# Patient Record
Sex: Female | Born: 1985 | Race: White | Hispanic: No | Smoking: Former smoker
Health system: Southern US, Community
[De-identification: ages and names within clinical notes are randomized; demographics above are authoritative.]

## PROBLEM LIST (undated history)

## (undated) ENCOUNTER — Inpatient Hospital Stay (HOSPITAL_COMMUNITY): Payer: Self-pay

## (undated) DIAGNOSIS — N200 Calculus of kidney: Secondary | ICD-10-CM

## (undated) DIAGNOSIS — F329 Major depressive disorder, single episode, unspecified: Secondary | ICD-10-CM

## (undated) DIAGNOSIS — R87619 Unspecified abnormal cytological findings in specimens from cervix uteri: Secondary | ICD-10-CM

## (undated) DIAGNOSIS — B977 Papillomavirus as the cause of diseases classified elsewhere: Secondary | ICD-10-CM

## (undated) DIAGNOSIS — G43909 Migraine, unspecified, not intractable, without status migrainosus: Secondary | ICD-10-CM

## (undated) DIAGNOSIS — R002 Palpitations: Secondary | ICD-10-CM

## (undated) DIAGNOSIS — H9191 Unspecified hearing loss, right ear: Secondary | ICD-10-CM

## (undated) DIAGNOSIS — F419 Anxiety disorder, unspecified: Secondary | ICD-10-CM

## (undated) DIAGNOSIS — B999 Unspecified infectious disease: Secondary | ICD-10-CM

## (undated) HISTORY — DX: Major depressive disorder, single episode, unspecified: F32.9

## (undated) HISTORY — DX: Anxiety disorder, unspecified: F41.9

## (undated) HISTORY — DX: Papillomavirus as the cause of diseases classified elsewhere: B97.7

## (undated) HISTORY — PX: DILATION AND CURETTAGE OF UTERUS: SHX78

## (undated) HISTORY — DX: Migraine, unspecified, not intractable, without status migrainosus: G43.909

## (undated) HISTORY — PX: OTHER SURGICAL HISTORY: SHX169

## (undated) HISTORY — DX: Unspecified abnormal cytological findings in specimens from cervix uteri: R87.619

---
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
## 2010-12-14 DIAGNOSIS — B977 Papillomavirus as the cause of diseases classified elsewhere: Secondary | ICD-10-CM

## 2010-12-14 HISTORY — DX: Papillomavirus as the cause of diseases classified elsewhere: B97.7

## 2010-12-14 HISTORY — PX: COLPOSCOPY: SHX161

## 2010-12-14 HISTORY — PX: LEEP: SHX91

## 2011-03-13 HISTORY — PX: OTHER SURGICAL HISTORY: SHX169

## 2015-03-06 ENCOUNTER — Ambulatory Visit (INDEPENDENT_AMBULATORY_CARE_PROVIDER_SITE_OTHER): Payer: BLUE CROSS/BLUE SHIELD | Admitting: Family Medicine

## 2015-03-06 VITALS — BP 110/66 | HR 56 | Temp 98.0°F | Resp 16 | Ht 64.5 in | Wt 208.0 lb

## 2015-03-06 DIAGNOSIS — N9489 Other specified conditions associated with female genital organs and menstrual cycle: Secondary | ICD-10-CM

## 2015-03-06 DIAGNOSIS — N898 Other specified noninflammatory disorders of vagina: Secondary | ICD-10-CM

## 2015-03-06 LAB — POCT WET PREP WITH KOH
BACTERIA WET PREP HPF POC: NEGATIVE
Clue Cells Wet Prep HPF POC: NEGATIVE
KOH Prep POC: NEGATIVE
RBC WET PREP PER HPF POC: NEGATIVE
Trichomonas, UA: NEGATIVE
WBC WET PREP PER HPF POC: NEGATIVE
Yeast Wet Prep HPF POC: NEGATIVE

## 2015-03-06 NOTE — Progress Notes (Signed)
[facility] [address] [place] [address] [phone]  Date:  03/06/2015   Name:  Jenny Anderson   DOB:  [birth year]   MRN:  [id number]  PCP:  No PCP Per Patient    Chief Complaint: Lost tampon   History of Present Illness:  Jenny Anderson is a 29 y.o. very pleasant female patient who presents with the following:  About 10 days ago she went [place] the zoo and when she got home she thought that she had removed her tampon but was not really sure.  In any case she forgot about it until she noticed some vaginal odor yesterday.  She is concerned that there could be a retained tampon. She had finished her menses a few days ago; did have some more prlonged brown discharge at the end of her menses.   Today she feels a little pressure, like there might a tampon inside.   No fever, chills or vomiting.  No belly pain.   She is generally in good health.   No urinary sx  There are no active problems to display for this patient.   Past Medical History  Diagnosis Date  . Anxiety   . Depression     History reviewed. No pertinent past surgical history.  History  Substance Use Topics  . Smoking status: Former Smoker    Quit date: 02/05/2015  . Smokeless tobacco: Not on file  . Alcohol Use: No    Family History  Problem Relation Age of Onset  . Hyperlipidemia Mother   . Cancer Maternal Grandfather   . Diabetes Paternal Grandmother   . Heart disease Paternal Grandmother   . Cancer Paternal Grandfather     Not on File  Medication list has been reviewed and updated.  No current outpatient prescriptions on file prior to visit.   No current facility-administered medications on file prior to visit.    Review of Systems:  As per HPI- otherwise negative.   Physical Examination: Filed Vitals:   03/06/15 1101  BP: 110/66  Pulse: 56  Temp: 98 F (36.7 C)  Resp: 16   Filed Vitals:   03/06/15 1101  Height: 5' 4.5" (1.638 m)  Weight: 208 lb  (94.348 kg)   Body mass index is 35.16 kg/(m^2). Ideal Body Weight: Weight in (lb) to have BMI = 25: 147.6  GEN: WDWN, NAD, Non-toxic, A & O x 3, overweight, looks well HEENT: Atraumatic, Normocephalic. Neck supple. No masses, No LAD. Ears and Nose: No external deformity. CV: RRR, No M/G/R. No JVD. No thrill. No extra heart sounds. PULM: CTA B, no wheezes, crackles, rhonchi. No retractions. No resp. distress. No accessory muscle use. ABD: S, NT, ND. No rebound. No HSM.  Benign belly EXTR: No c/c/e NEURO Normal gait.  PSYCH: Normally interactive. Conversant. Not depressed or anxious appearing.  Calm demeanor.  GU: no retained tampon on exam.  GU exam OW normal- no abnormal discharge, no lesion, no adnexal tenderness or masses, no CMT  Results for orders placed or performed in visit on 03/06/15  POCT Wet Prep with KOH  Result Value Ref Range   Trichomonas, UA Negative    Clue Cells Wet Prep HPF POC neg    Epithelial Wet Prep HPF POC 4-10    Yeast Wet Prep HPF POC neg    Bacteria Wet Prep HPF POC neg    RBC Wet Prep HPF POC neg    WBC Wet Prep HPF POC neg  KOH Prep POC Negative     Assessment and Plan: Vaginal odor - Plan: POCT Wet Prep with KOH  No retained tampon or other FB.  Reassured that her WP is normal.  She will let us know if not feeling back to normal soon- Sooner if worse.     Signed Lamar Blinks, MD

## 2015-03-06 NOTE — Patient Instructions (Signed)
I will check your swabs for any sign of a bacterial infection and will give you a call

## 2015-04-24 ENCOUNTER — Ambulatory Visit (INDEPENDENT_AMBULATORY_CARE_PROVIDER_SITE_OTHER): Payer: BLUE CROSS/BLUE SHIELD | Admitting: Physician Assistant

## 2015-04-24 VITALS — BP 122/74 | HR 78 | Temp 98.4°F | Resp 17 | Ht 64.5 in | Wt 212.0 lb

## 2015-04-24 DIAGNOSIS — Z20818 Contact with and (suspected) exposure to other bacterial communicable diseases: Secondary | ICD-10-CM

## 2015-04-24 DIAGNOSIS — Z2089 Contact with and (suspected) exposure to other communicable diseases: Secondary | ICD-10-CM | POA: Diagnosis not present

## 2015-04-24 DIAGNOSIS — R070 Pain in throat: Secondary | ICD-10-CM

## 2015-04-24 MED ORDER — AMOXICILLIN 875 MG PO TABS
875.0000 mg | ORAL_TABLET | Freq: Two times a day (BID) | ORAL | Status: AC
Start: 2015-04-24 — End: 2015-05-04

## 2015-04-24 NOTE — Progress Notes (Signed)
[facility] [address], [place] [address] [phone]  Date:  04/24/2015   Name:  Jenny Anderson   DOB:  [birth year]   MRN:  [id number]  PCP:  No PCP Per Patient    Chief Complaint: Sore Throat   History of Present Illness:  Jenny Anderson is a 29 y.o. very pleasant female patient who presents with the following:  Patient is here today with a sorethroat that began yesterday.  It is both sides, however she has noticed an aggravating right ear pain.  She has no fever, sob, dyspnea, chills, cough, or nasal congestion.  She was recently in our office 2 days ago, with daughter who had a sore throat with atypical features.  She had a positive strep and was treated.    There are no active problems to display for this patient.   Past Medical History  Diagnosis Date  . Anxiety   . Depression     History reviewed. No pertinent past surgical history.  History  Substance Use Topics  . Smoking status: Former Smoker    Quit date: 02/05/2015  . Smokeless tobacco: Not on file  . Alcohol Use: No    Family History  Problem Relation Age of Onset  . Hyperlipidemia Mother   . Cancer Maternal Grandfather   . Diabetes Paternal Grandmother   . Heart disease Paternal Grandmother   . Cancer Paternal Grandfather     No Known Allergies  Medication list has been reviewed and updated.  Current Outpatient Prescriptions on File Prior to Visit  Medication Sig Dispense Refill  . amphetamine-dextroamphetamine (ADDERALL) 15 MG tablet Take 15 mg by mouth daily.    . norgestimate-ethinyl estradiol (ORTHO-CYCLEN,SPRINTEC,PREVIFEM) 0.25-35 MG-MCG tablet Take 1 tablet by mouth daily.     No current facility-administered medications on file prior to visit.    Review of Systems: ROS otherwise unremarkable unless listed above.  Physical Examination: Filed Vitals:   04/24/15 0825  BP: 122/74  Pulse: 78  Temp: 98.4 F (36.9 C)  Resp: 17   Filed Vitals:   04/24/15 0825  Height: 5' 4.5" (1.638 m)  Weight: 212 lb (96.163 kg)   Body mass index is 35.84 kg/(m^2). Ideal Body Weight: Weight in (lb) to have BMI = 25: 147.6 Physical Exam  Constitutional: She is oriented to person, place, and time. She appears well-developed and well-nourished. No distress.  HENT:  Head: Normocephalic and atraumatic.  Right Ear: Tympanic membrane, external ear and ear canal normal.  Left Ear: Tympanic membrane, external ear and ear canal normal.  Nose: Nose normal. No mucosal edema or rhinorrhea.  Mouth/Throat: No uvula swelling. Posterior oropharyngeal erythema present. No oropharyngeal exudate or posterior oropharyngeal edema.  Eyes: Conjunctivae and EOM are normal. Pupils are equal, round, and reactive to light. Right eye exhibits no discharge. Left eye exhibits no discharge.  Neck: Normal range of motion. Neck supple. No tracheal deviation present. No thyromegaly present.  Cardiovascular: Normal rate, regular rhythm and normal heart sounds.  Exam reveals no friction rub.   No murmur heard. Pulmonary/Chest: Effort normal and breath sounds normal. No respiratory distress. She has no wheezes.  Lymphadenopathy:    She has no cervical adenopathy.  Neurological: She is alert and oriented to person, place, and time.  Skin: Skin is warm and dry. No rash noted. She is not diaphoretic.  Psychiatric: She has a normal mood and affect. Her behavior is normal.     Assessment and Plan: 29 year old female  is here today for chief complaint of sore throat.  Daughter was here two days ago with positive strep.   -Though not a great presentation, direct exposure and sxs significant.  Will treat today. -Declines, magic mouthwash.  Advised cepacol, tylenol, and ibuprofen for pain.  Exposure to strep throat - Plan: amoxicillin (AMOXIL) 875 MG tablet  Throat pain - Plan: amoxicillin (AMOXIL) 875 MG tablet   Ivar Drape, PA-C [facility] 5/13/201611:51 AM

## 2015-04-24 NOTE — Patient Instructions (Signed)

## 2015-05-01 ENCOUNTER — Encounter: Payer: Self-pay | Admitting: Certified Nurse Midwife

## 2015-07-04 ENCOUNTER — Ambulatory Visit (INDEPENDENT_AMBULATORY_CARE_PROVIDER_SITE_OTHER): Payer: BLUE CROSS/BLUE SHIELD | Admitting: Certified Nurse Midwife

## 2015-07-04 ENCOUNTER — Encounter: Payer: Self-pay | Admitting: Certified Nurse Midwife

## 2015-07-04 VITALS — BP 120/70 | HR 70 | Resp 16 | Ht 64.25 in | Wt 212.0 lb

## 2015-07-04 DIAGNOSIS — Z8742 Personal history of other diseases of the female genital tract: Secondary | ICD-10-CM | POA: Diagnosis not present

## 2015-07-04 DIAGNOSIS — Z01419 Encounter for gynecological examination (general) (routine) without abnormal findings: Secondary | ICD-10-CM

## 2015-07-04 DIAGNOSIS — Z0000 Encounter for general adult medical examination without abnormal findings: Secondary | ICD-10-CM | POA: Diagnosis not present

## 2015-07-04 DIAGNOSIS — Z124 Encounter for screening for malignant neoplasm of cervix: Secondary | ICD-10-CM | POA: Diagnosis not present

## 2015-07-04 DIAGNOSIS — Z3041 Encounter for surveillance of contraceptive pills: Secondary | ICD-10-CM | POA: Diagnosis not present

## 2015-07-04 LAB — POCT URINALYSIS DIPSTICK
BILIRUBIN UA: NEGATIVE
Blood, UA: NEGATIVE
Glucose, UA: NEGATIVE
KETONES UA: NEGATIVE
LEUKOCYTES UA: NEGATIVE
NITRITE UA: NEGATIVE
Protein, UA: NEGATIVE
Urobilinogen, UA: NEGATIVE
pH, UA: 5

## 2015-07-04 MED ORDER — NORGESTIMATE-ETH ESTRADIOL 0.25-35 MG-MCG PO TABS: 1.0000 | ORAL_TABLET | Freq: Every day | ORAL | Status: DC

## 2015-07-04 NOTE — Progress Notes (Addendum)
29 y.o. Jenny Anderson Married  Caucasian Fe here to establish gyn care and for annual exam. Moved here recently from [place]. Has established with PCP was seen for UTI. Periods normal, no issues . Contraception Rankin working well. Previous Mirena IUD with pregnancy, undetected, removed with SAB. Previous Nuvaring use , unhappy with insertion and removal monthly. Prefers OCP. Previous Adderall use only for work and attention issues. No other health issues today.  Patient's last menstrual period was 06/20/2015.          Sexually active: Yes.    The current method of family planning is OCP (estrogen/progesterone).    Exercising: No.  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 She does not have any smokeless tobacco history on file. She reports that she does not drink alcohol or use illicit drugs.  Past Medical History  Diagnosis Date  . Anxiety   . Depression   . HPV in female 2012  . Abnormal Pap smear of cervix   . Migraines     sensitivity to light    Past Surgical History  Procedure Laterality Date  . Leep  2012  . Colposcopy  2012  . Dilation and curettage of uterus    . Mirena      pt was pregnant with iud in place    Current Outpatient Prescriptions  Medication Sig Dispense Refill  . norgestimate-ethinyl estradiol (ORTHO-CYCLEN,SPRINTEC,PREVIFEM) 0.25-35 MG-MCG tablet Take 1 tablet by mouth daily.    Marland Kitchen amphetamine-dextroamphetamine (ADDERALL) 10 MG tablet Take 10 mg by mouth as needed.    . sulfamethoxazole-trimethoprim (BACTRIM DS,SEPTRA DS) 800-160 MG per tablet      No current facility-administered medications for this visit.    Family History  Problem Relation Age of Onset  . Hyperlipidemia Mother   .  Hypertension Mother   . Other Mother     hx of substance abuse  . Lung cancer Maternal Grandfather   . Lung cancer Paternal Grandfather   . Hyperlipidemia Paternal Grandfather   . Other Father     hx substance abuse  . Hypertension Maternal Grandmother   . Hyperlipidemia Maternal Grandmother   . Heart disease Maternal Grandmother   . Diabetes Maternal Grandmother   . Kidney disease Maternal Grandmother     ROS:  Pertinent items are noted in HPI.  Otherwise, a comprehensive ROS was negative.  Exam:   BP 120/70 mmHg  Pulse 70  Resp 16  Ht 5' 4.25" (1.632 m)  Wt 212 lb (96.163 kg)  BMI 36.11 kg/m2  LMP 06/20/2015 Height: 5' 4.25" (163.2 cm) Ht Readings from Last 3 Encounters:  07/04/15 5' 4.25" (1.632 m)  04/24/15 5' 4.5" (1.638 m)  03/06/15 5' 4.5" (1.638 m)    General appearance: alert, cooperative and appears stated age Head: Normocephalic, without obvious abnormality, atraumatic Neck: no adenopathy, supple, symmetrical, trachea midline and thyroid normal to inspection and palpation Lungs: clear to auscultation bilaterally Breasts: normal appearance, no masses or tenderness, No nipple retraction or dimpling, No nipple discharge or bleeding, No axillary or supraclavicular adenopathy Heart: regular rate and rhythm Abdomen: soft, non-tender; no masses,  no organomegaly, umbilical area of piercing slight increase pink with ointment noted on  area, no exudate Extremities: extremities normal, atraumatic, no cyanosis or edema Skin: Skin color, texture, turgor normal. No rashes or lesions Lymph nodes: Cervical, supraclavicular, and axillary nodes normal. No abnormal inguinal nodes palpated Neurologic: Grossly normal   Pelvic: External genitalia:  no lesions              Urethra:  normal appearing urethra with no masses, tenderness or lesions              Bartholin's and Skene's: normal                 Vagina: normal appearing vagina with normal color and discharge, no lesions               Cervix: normal, non tender, no lesions CKC appearance, slight bleeding with pap only              Pap taken: Yes.   Bimanual Exam:  Uterus:  normal size, contour, position, consistency, mobility, non-tender              Adnexa: normal adnexa and no mass, fullness, tenderness               Rectovaginal: Confirms               Anus:  normal appearance  Chaperone present: Yes  A:  Well Woman with normal exam  Contraception desires OCP  Previous history of abnormal paps with HPV and dysplasia, with LEEP in 2012  Previous smoker has now stopped for the past 4 months  History of ADD with Aderral use prn PCP management  Skin inflammation around piercing at umbilicus, has antibiotic RX, for treatment  P:   Reviewed health and wellness pertinent to exam  Rx Sprintec see order  Discussed importance of yearly pap smears for 20 years from CKC date of 02/25/11  Discussed nicotine and HPV relationship and encouraged to continue to be a non smoker  Continue follow up with MD as needed  Pap smear taken wit HPVHR   counseled on breast self exam, use and side effects of OCP's, adequate intake of calcium and vitamin D, diet and exercise  return annually or prn  An After Visit Summary was printed and given to the patient.

## 2015-07-04 NOTE — Patient Instructions (Signed)
General topics  Next pap or exam is  due in 1 year Take a Women's multivitamin Take 1200 mg. of calcium daily - prefer dietary If any concerns in interim to call back  Breast Self-Awareness Practicing breast self-awareness may pick up problems early, prevent significant medical complications, and possibly save your life. By practicing breast self-awareness, you can become familiar with how your breasts look and feel and if your breasts are changing. This allows you to notice changes early. It can also offer you some reassurance that your breast health is good. One way to learn what is normal for your breasts and whether your breasts are changing is to do a breast self-exam. If you find a lump or something that was not present in the past, it is best to contact your caregiver right away. Other findings that should be evaluated by your caregiver include nipple discharge, especially if it is bloody; skin changes or reddening; areas where the skin seems to be pulled in (retracted); or new lumps and bumps. Breast pain is seldom associated with cancer (malignancy), but should also be evaluated by a caregiver. BREAST SELF-EXAM The best time to examine your breasts is 5 7 days after your menstrual period is over.  ExitCare Patient Information 2013 [employer].   Exercise to Stay Healthy Exercise helps you become and stay healthy. EXERCISE IDEAS AND TIPS Choose exercises that:  You enjoy.  Fit into your day. You do not need to exercise really hard to be healthy. You can do exercises at a slow or medium level and stay healthy. You can:  Stretch before and after working out.  Try yoga, Pilates, or tai chi.  Lift weights.  Walk fast, swim, jog, run, climb stairs, bicycle, dance, or rollerskate.  Take aerobic classes. Exercises that burn about 150 calories:  Running 1  miles in 15 minutes.  Playing volleyball for 45 to 60 minutes.  Washing and waxing a car for 45 to 60  minutes.  Playing touch football for 45 minutes.  Walking 1  miles in 35 minutes.  Pushing a stroller 1  miles in 30 minutes.  Playing basketball for 30 minutes.  Raking leaves for 30 minutes.  Bicycling 5 miles in 30 minutes.  Walking 2 miles in 30 minutes.  Dancing for 30 minutes.  Shoveling snow for 15 minutes.  Swimming laps for 20 minutes.  Walking up stairs for 15 minutes.  Bicycling 4 miles in 15 minutes.  Gardening for 30 to 45 minutes.  Jumping rope for 15 minutes.  Washing windows or floors for 45 to 60 minutes. Document Released: 01/02/2011 Document Revised: 02/22/2012 Document Reviewed: 01/02/2011 ExitCare Patient Information 2013 [employer].   Other topics ( that may be useful information):    Sexually Transmitted Disease Sexually transmitted disease (STD) refers to any infection that is passed from person to person during sexual activity. This may happen by way of saliva, semen, blood, vaginal mucus, or urine. Common STDs include:  Gonorrhea.  Chlamydia.  Syphilis.  HIV/AIDS.  Genital herpes.  Hepatitis B and C.  Trichomonas.  Human papillomavirus (HPV).  Pubic lice. CAUSES  An STD may be spread by bacteria, virus, or parasite. A person can get an STD by:  Sexual intercourse with an infected person.  Sharing sex toys with an infected person.  Sharing needles with an infected person.  Having intimate contact with the genitals, mouth, or rectal areas of an infected person. SYMPTOMS  Some people may not have any symptoms, but   they can still pass the infection to others. Different STDs have different symptoms. Symptoms include:  Painful or bloody urination.  Pain in the pelvis, abdomen, vagina, anus, throat, or eyes.  Skin rash, itching, irritation, growths, or sores (lesions). These usually occur in the genital or anal area.  Abnormal vaginal discharge.  Penile discharge in men.  Soft, flesh-colored skin growths in the  genital or anal area.  Fever.  Pain or bleeding during sexual intercourse.  Swollen glands in the groin area.  Yellow skin and eyes (jaundice). This is seen with hepatitis. DIAGNOSIS  To make a diagnosis, your caregiver may:  Take a medical history.  Perform a physical exam.  Take a specimen (culture) to be examined.  Examine a sample of discharge under a microscope.  Perform blood test TREATMENT   Chlamydia, gonorrhea, trichomonas, and syphilis can be cured with antibiotic medicine.  Genital herpes, hepatitis, and HIV can be treated, but not cured, with prescribed medicines. The medicines will lessen the symptoms.  Genital warts from HPV can be treated with medicine or by freezing, burning (electrocautery), or surgery. Warts may come back.  HPV is a virus and cannot be cured with medicine or surgery.However, abnormal areas may be followed very closely by your caregiver and may be removed from the cervix, vagina, or vulva through office procedures or surgery. If your diagnosis is confirmed, your recent sexual partners need treatment. This is true even if they are symptom-free or have a negative culture or evaluation. They should not have sex until their caregiver says it is okay. HOME CARE INSTRUCTIONS  All sexual partners should be informed, tested, and treated for all STDs.  Take your antibiotics as directed. Finish them even if you start to feel better.  Only take over-the-counter or prescription medicines for pain, discomfort, or fever as directed by your caregiver.  Rest.  Eat a balanced diet and drink enough fluids to keep your urine clear or pale yellow.  Do not have sex until treatment is completed and you have followed up with your caregiver. STDs should be checked after treatment.  Keep all follow-up appointments, Pap tests, and blood tests as directed by your caregiver.  Only use latex condoms and water-soluble lubricants during sexual activity. Do not use  petroleum jelly or oils.  Avoid alcohol and illegal drugs.  Get vaccinated for HPV and hepatitis. If you have not received these vaccines in the past, talk to your caregiver about whether one or both might be right for you.  Avoid risky sex practices that can break the skin. The only way to avoid getting an STD is to avoid all sexual activity.Latex condoms and dental dams (for oral sex) will help lessen the risk of getting an STD, but will not completely eliminate the risk. SEEK MEDICAL CARE IF:   You have a fever.  You have any new or worsening symptoms. Document Released: 02/20/2003 Document Revised: 02/22/2012 Document Reviewed: 02/27/2011 [facility] Patient Information 2013 [place].    Domestic Abuse You are being battered or abused if someone close to you hits, pushes, or physically hurts you in any way. You also are being abused if you are forced into activities. You are being sexually abused if you are forced to have sexual contact of any kind. You are being emotionally abused if you are made to feel worthless or if you are constantly threatened. It is important to remember that help is available. No one has the right to abuse you. PREVENTION OF FURTHER  ABUSE  Learn the warning signs of danger. This varies with situations but may include: the use of alcohol, threats, isolation from friends and family, or forced sexual contact. Leave if you feel that violence is going to occur.  If you are attacked or beaten, report it to the police so the abuse is documented. You do not have to press charges. The police can protect you while you or the attackers are leaving. Get the officer's name and badge number and a copy of the report.  Find someone you can trust and tell them what is happening to you: your caregiver, a nurse, clergy member, close friend or family member. Feeling ashamed is natural, but remember that you have done nothing wrong. No one deserves abuse. Document Released:  11/27/2000 Document Revised: 02/22/2012 Document Reviewed: 02/05/2011 ExitCare Patient Information 2013 [employer].    How Much is Too Much Alcohol? Drinking too much alcohol can cause injury, accidents, and health problems. These types of problems can include:   Car crashes.  Falls.  Family fighting (domestic violence).  Drowning.  Fights.  Injuries.  Burns.  Damage to certain organs.  Having a baby with birth defects. ONE DRINK CAN BE TOO MUCH WHEN YOU ARE:  Working.  Pregnant or breastfeeding.  Taking medicines. Ask your doctor.  Driving or planning to drive. If you or someone you know has a drinking problem, get help from a doctor.  Document Released: 09/26/2009 Document Revised: 02/22/2012 Document Reviewed: 09/26/2009 ExitCare Patient Information 2013 [employer]Care[employer].   Smoking Hazards Smoking cigarettes is extremely bad for your health. Tobacco smoke has over 200 known poisons in it. There are over 60 chemicals in tobacco smoke that cause cancer. Some of the chemicals found in cigarette smoke include:   Cyanide.  Benzene.  Formaldehyde.  Methanol (wood alcohol).  Acetylene (fuel used in welding torches).  Ammonia. Cigarette smoke also contains the poisonous gases nitrogen oxide and carbon monoxide.  Cigarette smokers have an increased risk of many serious medical problems and Smoking causes approximately:  90% of all lung cancer deaths in men.  80% of all lung cancer deaths in women.  90% of deaths from chronic obstructive lung disease. Compared with nonsmokers, smoking increases the risk of:  Coronary heart disease by 2 to 4 times.  Stroke by 2 to 4 times.  Men developing lung cancer by 23 times.  Women developing lung cancer by 13 times.  Dying from chronic obstructive lung diseases by 12 times.  . Smoking is the most preventable cause of death and disease in our society.  WHY IS SMOKING ADDICTIVE?  Nicotine is the chemical  agent in tobacco that is capable of causing addiction or dependence.  When you smoke and inhale, nicotine is absorbed rapidly into the bloodstream through your lungs. Nicotine absorbed through the lungs is capable of creating a powerful addiction. Both inhaled and non-inhaled nicotine may be addictive.  Addiction studies of cigarettes and spit tobacco show that addiction to nicotine occurs mainly during the teen years, when young people begin using tobacco products. WHAT ARE THE BENEFITS OF QUITTING?  There are many health benefits to quitting smoking.   Likelihood of developing cancer and heart disease decreases. Health improvements are seen almost immediately.  Blood pressure, pulse rate, and breathing patterns start returning to normal soon after quitting. QUITTING SMOKING   American Lung Association - 1-800-LUNGUSA  American Cancer Society - 1-800-ACS-2345 Document Released: 01/07/2005 Document Revised: 02/22/2012 Document Reviewed: 09/11/2009 ExitCare Patient Information 2013 ExitCare,   LLC.   Stress Management Stress is a state of physical or mental tension that often results from changes in your life or normal routine. Some common causes of stress are:  Death of a loved one.  Injuries or severe illnesses.  Getting fired or changing jobs.  Moving into a new home. Other causes may be:  Sexual problems.  Business or financial losses.  Taking on a large debt.  Regular conflict with someone at home or at work.  Constant tiredness from lack of sleep. It is not just bad things that are stressful. It may be stressful to:  Win the lottery.  Get married.  Buy a new car. The amount of stress that can be easily tolerated varies from person to person. Changes generally cause stress, regardless of the types of change. Too much stress can affect your health. It may lead to physical or emotional problems. Too little stress (boredom) may also become stressful. SUGGESTIONS TO  REDUCE STRESS:  Talk things over with your family and friends. It often is helpful to share your concerns and worries. If you feel your problem is serious, you may want to get help from a professional counselor.  Consider your problems one at a time instead of lumping them all together. Trying to take care of everything at once may seem impossible. List all the things you need to do and then start with the most important one. Set a goal to accomplish 2 or 3 things each day. If you expect to do too many in a single day you will naturally fail, causing you to feel even more stressed.  Do not use alcohol or drugs to relieve stress. Although you may feel better for a short time, they do not remove the problems that caused the stress. They can also be habit forming.  Exercise regularly - at least 3 times per week. Physical exercise can help to relieve that "uptight" feeling and will relax you.  The shortest distance between despair and hope is often a good night's sleep.  Go to bed and get up on time allowing yourself time for appointments without being rushed.  Take a short "time-out" period from any stressful situation that occurs during the day. Close your eyes and take some deep breaths. Starting with the muscles in your face, tense them, hold it for a few seconds, then relax. Repeat this with the muscles in your neck, shoulders, hand, stomach, back and legs.  Take good care of yourself. Eat a balanced diet and get plenty of rest.  Schedule time for having fun. Take a break from your daily routine to relax. HOME CARE INSTRUCTIONS   Call if you feel overwhelmed by your problems and feel you can no longer manage them on your own.  Return immediately if you feel like hurting yourself or someone else. Document Released: 05/26/2001 Document Revised: 02/22/2012 Document Reviewed: 01/16/2008 [facility] Patient Information 2013 [place].  Folic Acid in Pregnancy Folic acid is a B vitamin that  helps prevent neural tube defects (NTDs). The neural tube is the part of a developing baby that becomes the brain and spinal cord. When the neural tube does not close properly, a baby is born with an NTD. NTDs include spina bifida, hernia of the spinal cord, and the absence of part or all of the brain (anencephaly).  Take folic acid at least 4 weeks before getting pregnant and through the first 3 months of pregnancy. This is when the neural tube is developing. It  is available in most multivitamins, as a folic-acid-only supplement, and in some foods. Taking the right amount of folic acid before conception and during pregnancy lessens the chance of having a baby born with an NTD. Giving folic acid will not affect a neural tube defect if it is already present. DIAGNOSIS   An alpha fetoprotein (AFP) blood or amniotic fluid test will show high levels of the alpha fetoprotein if a woman is carrying a baby with an NTD. This test is done on all pregnant women in the first trimester.  An ultrasound may detect an NTD. WHAT YOU CAN DO:  Take a multivitamin with at least 0.4 milligrams (400 micrograms) of folic acid daily at least 4 weeks before getting pregnant and through the first 12 weeks of pregnancy.  If you have already had a pregnancy affected by an NTD, take 4 milligrams (4,000 micrograms) of folic acid daily. Take this amount 1 month before you start trying to get pregnant and continue through the first 3 months of pregnancy. If you have a seizure disorder or take medicines to control seizures, tell your maternity care provider. Continue to take your folic acid unless you are told otherwise.  FOLIC ACID IN FOODS Eat a healthy diet that has foods that contain folic acid, the natural form of the vitamin. Such foods include:  Fortified breakfast cereals.  Lentils.  Asparagus.  Spinach.  Organ meats (liver).  Black beans.  Peanuts (eat only if you do not have a peanut  allergy).  Broccoli.  Strawberries, oranges.  Orange juice (from concentrate is best).  Enriched breads and pasta.  Romaine lettuce. TALK TO YOUR HEALTH CARE PROVIDER IF:  You are in your first trimester and have high blood sugar.  You are in your first trimester and develop a high fever. In almost all cases, a fetus found to have an NTD will need specialized care that may not be available in all hospitals. Talk to your health care provider about what is best for you and your baby. Document Released: 12/03/2003 Document Revised: 04/16/2014 Document Reviewed: 03/05/2010 [facility] Patient Information 2015 [place], [place]. This information is not intended to replace advice given to you by your health care provider. Make sure you discuss any questions you have with your health care provider.

## 2015-07-04 NOTE — Progress Notes (Signed)
Reviewed personally.  M. Suzanne Wilfrido Luedke, MD.  

## 2015-07-08 LAB — IPS PAP TEST WITH HPV

## 2015-07-09 ENCOUNTER — Encounter: Payer: Self-pay | Admitting: Certified Nurse Midwife

## 2015-07-09 NOTE — Addendum Note (Signed)
Addended by: Regina Eck on: 07/09/2015 12:43 PM   Modules accepted: Miquel Dunn

## 2015-10-10 ENCOUNTER — Ambulatory Visit (INDEPENDENT_AMBULATORY_CARE_PROVIDER_SITE_OTHER): Payer: BLUE CROSS/BLUE SHIELD | Admitting: Physician Assistant

## 2015-10-10 VITALS — BP 122/80 | HR 74 | Temp 99.0°F | Resp 18 | Ht 64.5 in | Wt 219.0 lb

## 2015-10-10 DIAGNOSIS — H5711 Ocular pain, right eye: Secondary | ICD-10-CM | POA: Diagnosis not present

## 2015-10-10 MED ORDER — GENTAMICIN SULFATE 0.3 % OP SOLN: 1.0000 [drp] | OPHTHALMIC | Status: AC

## 2015-10-10 NOTE — Progress Notes (Signed)
   Jenny Anderson  MRN: [id number] DOB: 09-20-1986  Subjective:  Pt presents to clinic with eye irritation since yesterday.  She was sent home from work yesterday due to her red eye.  She has had no injury to her eye that she knows of. She is worried because she has family coming from out of town tomorrow with a baby and that worries her.  Her eye is itchy and irritated but this am when she woke up her right eye goop in the corner on the eye.  She has noticed no drainage since she woke up this am at 5am.  Home treatment - motrin, warm compresses  Patient Active Problem List   Diagnosis Date Noted  . History of abnormal cervical Pap smear 07/04/2015    Current Outpatient Prescriptions on File Prior to Visit  Medication Sig Dispense Refill  . norgestimate-ethinyl estradiol (ORTHO-CYCLEN,SPRINTEC,PREVIFEM) 0.25-35 MG-MCG tablet Take 1 tablet by mouth daily. 1 Package 12   No current facility-administered medications on file prior to visit.    No Known Allergies  Review of Systems  Constitutional: Negative for chills.  HENT: Negative for congestion and sneezing.   Allergic/Immunologic: Negative for environmental allergies.   Objective:  BP 122/80 mmHg  Pulse 74  Temp(Src) 99 F (37.2 C) (Oral)  Resp 18  Ht 5' 4.5" (1.638 m)  Wt 219 lb (99.338 kg)  BMI 37.02 kg/m2  SpO2 97%  LMP 10/08/2015  Physical Exam  Constitutional: She is oriented to person, place, and time and well-developed, well-nourished, and in no distress.  HENT:  Head: Normocephalic and atraumatic.  Right Ear: Hearing and external ear normal.  Left Ear: Hearing and external ear normal.  Eyes: Conjunctivae, EOM and lids are normal. Pupils are equal, round, and reactive to light. Right eye exhibits no exudate and no hordeolum. Left eye exhibits no exudate and no hordeolum. Right conjunctiva is not injected. Right conjunctiva has no hemorrhage. Left conjunctiva is not injected. Left conjunctiva has no hemorrhage.  No scleral icterus. Right eye exhibits normal extraocular motion and no nystagmus. Left eye exhibits normal extraocular motion and no nystagmus.  She has some pain with eyeball pressure with palpation, mild light sensitivity.  Neck: Normal range of motion.  Pulmonary/Chest: Effort normal.  Neurological: She is alert and oriented to person, place, and time. Gait normal.  Skin: Skin is warm and dry.  Psychiatric: Mood, memory, affect and judgment normal.  Vitals reviewed.    Visual Acuity Screening   Right eye Left eye Both eyes  Without correction: 20/15 20/15 20/15   With correction:       Assessment and Plan :  Eye pain, right - Plan: gentamicin (GARAMYCIN) 0.3 % ophthalmic solution   This is most likely an allergic conjunctivitis but due to her pain and slight light sensitivity we will send an abx to the pharmacy that she will have available to her if her symptoms gets worse.  She will use symptomatic treatment as needed until then.  We discussed that she should have good hand washing technique.  Windell Hummingbird PA-C  [facility] 10/10/2015 8:56 AM

## 2015-10-10 NOTE — Patient Instructions (Signed)
Hold the drops for now - but start using them if your red becomes red and pus draining out of it.

## 2015-12-15 NOTE — L&D Delivery Note (Signed)
Delivery Note  First Stage: Labor onset: 1950 with onset of pitocin augmentation Analgesia /Anesthesia intrapartum: epidural SROM at 0800 without onset of labor within 12 hours  Second Stage: Complete dilation at 0339 Onset of pushing at 0345 FHR second stage category 2  Delivery of a viable female at 77 by CNM in ROA position no nuchal cord Cord double clamped after cessation of pulsation, cut by FOB Cord blood sample collected   Third Stage: Placenta delivered [facility] intact with 3 VC @ 0358 Placenta disposition: hospital disposal Uterine tone firm / bleeding scant  no laceration identified   Verbal and written consent for vulvar biopsy to removal 57mm raised lesion from left vulva at groin margin specimen to pathology  1% xylocaine local to site - removal of lesion with #10 blade Silver nitrate for hemostasis  Est. Blood Loss (mL): [id number]  Complications: none  Mom to postpartum.  Baby to Couplet care / Skin to Skin.  Newborn: Birth Weight: 7.05 (3190gm)  Apgar Scores: 8-9 Feeding planned: breast  Artelia Laroche CNM, MSN, [place] 10/22/2016, 4:32 AM

## 2016-04-06 LAB — OB RESULTS CONSOLE HEPATITIS B SURFACE ANTIGEN: HEP B S AG: NEGATIVE

## 2016-04-06 LAB — OB RESULTS CONSOLE RUBELLA ANTIBODY, IGM: RUBELLA: IMMUNE

## 2016-04-06 LAB — OB RESULTS CONSOLE RPR: RPR: NONREACTIVE

## 2016-04-06 LAB — OB RESULTS CONSOLE HIV ANTIBODY (ROUTINE TESTING): HIV: NONREACTIVE

## 2016-04-06 LAB — OB RESULTS CONSOLE GC/CHLAMYDIA
Chlamydia: NEGATIVE
Gonorrhea: NEGATIVE

## 2016-04-06 LAB — OB RESULTS CONSOLE ABO/RH: RH Type: POSITIVE

## 2016-07-09 ENCOUNTER — Ambulatory Visit: Payer: BLUE CROSS/BLUE SHIELD | Admitting: Certified Nurse Midwife

## 2016-08-02 ENCOUNTER — Inpatient Hospital Stay (HOSPITAL_COMMUNITY)
Admission: AD | Admit: 2016-08-02 | Discharge: 2016-08-02 | Disposition: A | Discharge status: Home / Self Care | Payer: BLUE CROSS/BLUE SHIELD | Source: Ambulatory Visit | Attending: Obstetrics & Gynecology | Admitting: Obstetrics & Gynecology

## 2016-08-02 ENCOUNTER — Inpatient Hospital Stay (HOSPITAL_COMMUNITY): Payer: BLUE CROSS/BLUE SHIELD

## 2016-08-02 ENCOUNTER — Encounter (HOSPITAL_COMMUNITY): Payer: Self-pay | Admitting: *Deleted

## 2016-08-02 DIAGNOSIS — O26872 Cervical shortening, second trimester: Secondary | ICD-10-CM | POA: Insufficient documentation

## 2016-08-02 DIAGNOSIS — R102 Pelvic and perineal pain: Secondary | ICD-10-CM | POA: Insufficient documentation

## 2016-08-02 DIAGNOSIS — O3440 Maternal care for other abnormalities of cervix, unspecified trimester: Secondary | ICD-10-CM | POA: Diagnosis not present

## 2016-08-02 DIAGNOSIS — O26892 Other specified pregnancy related conditions, second trimester: Secondary | ICD-10-CM | POA: Diagnosis not present

## 2016-08-02 DIAGNOSIS — O23592 Infection of other part of genital tract in pregnancy, second trimester: Secondary | ICD-10-CM | POA: Diagnosis not present

## 2016-08-02 DIAGNOSIS — Z87891 Personal history of nicotine dependence: Secondary | ICD-10-CM | POA: Insufficient documentation

## 2016-08-02 DIAGNOSIS — O9989 Other specified diseases and conditions complicating pregnancy, childbirth and the puerperium: Secondary | ICD-10-CM | POA: Insufficient documentation

## 2016-08-02 DIAGNOSIS — B9689 Other specified bacterial agents as the cause of diseases classified elsewhere: Secondary | ICD-10-CM

## 2016-08-02 DIAGNOSIS — G43909 Migraine, unspecified, not intractable, without status migrainosus: Secondary | ICD-10-CM | POA: Diagnosis not present

## 2016-08-02 DIAGNOSIS — N760 Acute vaginitis: Secondary | ICD-10-CM

## 2016-08-02 DIAGNOSIS — O4692 Antepartum hemorrhage, unspecified, second trimester: Secondary | ICD-10-CM | POA: Insufficient documentation

## 2016-08-02 DIAGNOSIS — O99342 Other mental disorders complicating pregnancy, second trimester: Secondary | ICD-10-CM | POA: Insufficient documentation

## 2016-08-02 DIAGNOSIS — A499 Bacterial infection, unspecified: Secondary | ICD-10-CM

## 2016-08-02 DIAGNOSIS — F419 Anxiety disorder, unspecified: Secondary | ICD-10-CM | POA: Insufficient documentation

## 2016-08-02 DIAGNOSIS — Z3A26 26 weeks gestation of pregnancy: Secondary | ICD-10-CM | POA: Diagnosis not present

## 2016-08-02 LAB — URINE MICROSCOPIC-ADD ON

## 2016-08-02 LAB — URINALYSIS, ROUTINE W REFLEX MICROSCOPIC
Bilirubin Urine: NEGATIVE
GLUCOSE, UA: NEGATIVE mg/dL
Ketones, ur: NEGATIVE mg/dL
LEUKOCYTES UA: NEGATIVE
Nitrite: NEGATIVE
PH: 7 (ref 5.0–8.0)
Protein, ur: NEGATIVE mg/dL
SPECIFIC GRAVITY, URINE: 1.015 (ref 1.005–1.030)

## 2016-08-02 LAB — WET PREP, GENITAL
Sperm: NONE SEEN
Trich, Wet Prep: NONE SEEN
YEAST WET PREP: NONE SEEN

## 2016-08-02 LAB — FETAL FIBRONECTIN: Fetal Fibronectin: NEGATIVE

## 2016-08-02 MED ORDER — METRONIDAZOLE 500 MG PO TABS: 500.0000 mg | ORAL_TABLET | Freq: Two times a day (BID) | ORAL | 0 refills | Status: AC

## 2016-08-02 NOTE — MAU Provider Note (Signed)
Chief Complaint:  Vaginal Bleeding   First Provider Initiated Contact with Patient 08/02/16 1637     HPI: Jenny Anderson is a 30 y.o. G4P2014 at [redacted]w[redacted]d who presents to maternity admissions reporting vaginal bleeding (clot), right sided pelvic pain, and blood in urine.  Woke this morning with mild right sided lower pelvic pain without radiation, after mowing lawn, became crampy in nature with some intense pain but no sharp pain. No pattern to pain. Pain has been improving throughout the day, sitting makes it feel better, activity makes it feel worse. Pain has been gone since lunch.  Around lunchtime used bathroom, noticed reddish-orange colored urine and scant blood on toilet paper, and about 1 hour later, was more red and passed a small dime-sized clot. Normal mucous discharge otherwise. Did not notice any blood in underwear. Had a BM earlier today, normal, without blood. Has h/o kidney stone 10 years ago.  Denies contractions, leakage of fluid. Good fetal movement.   Pregnancy Course:  Insufficient cervix 2/2 h/o LEEP- receiving weekly [place] to monitor, used PO progesterone in the beginning of pregnancy until 13 wks, no cerclage performed.  Past Medical History: Past Medical History:  Diagnosis Date  . Abnormal Pap smear of cervix   . Anxiety   . Depression   . HPV in female 2012  . Migraines    sensitivity to light    Past obstetric history: OB History  Gravida Para Term Preterm AB Living  4 2 2   1 4   SAB TAB Ectopic Multiple Live Births  1       2    # Outcome Date GA Lbr Len/2nd Weight Sex Delivery Anes PTL Lv  4 Current           3 SAB           2 Term     F Vag-Spont   LIV  1 Term     F Vag-Spont   LIV      Past Surgical History: Past Surgical History:  Procedure Laterality Date  . 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 COLPOSCOPY  2012  . DILATION AND CURETTAGE OF UTERUS    . LEEP  2012  . mirena     pt was pregnant with iud in place      Family History: Family History  Problem Relation Age of Onset  . Hyperlipidemia Mother   . Hypertension Mother   . Other Mother     hx of substance abuse  . Lung cancer Maternal Grandfather   . Lung cancer Paternal Grandfather   . Hyperlipidemia Paternal Grandfather   . Other Father     hx substance abuse  . Hypertension Maternal Grandmother   . Hyperlipidemia Maternal Grandmother   . Heart disease Maternal Grandmother   . Diabetes Maternal Grandmother   . Kidney disease Maternal Grandmother     Social History: Social History  Substance Use Topics  . Smoking status: Former Smoker    Quit date: 02/05/2015  . Smokeless tobacco: Never Used  . Alcohol use No    Allergies: No Known Allergies  Meds:  No prescriptions prior to admission.    I have reviewed patient's Past Medical Hx, Surgical Hx, Family Hx, Social Hx, medications and allergies.   ROS:  Review of Systems  Physical Exam   Patient Vitals for the past 24 hrs:  BP Temp Pulse Resp  08/02/16 1848 119/67 - -  18  08/02/16 1612 108/67 98.5 F (36.9 C) 73 16   Constitutional: Well-developed, well-nourished female in no acute distress.  Cardiovascular: normal rate Respiratory: normal effort GI: Abd soft, non-tender, gravid appropriate for gestational age. Pos BS x 4 MS: Extremities nontender, no edema, normal ROM Neurologic: Alert and oriented x 4.  GU: Neg CVAT.  Pelvic: NEFG, physiologic discharge, no blood, cervix clean. No CMT.   Rectal: No noticeable external hemorrhoids.     FHT:  Baseline 130 , moderate variability, accelerations present, no decelerations Contractions: None   Labs: Results for orders placed or performed during the hospital encounter of 08/02/16 (from the past 24 hour(s))  Urinalysis, Routine w reflex microscopic (not at [facility])     Status: Abnormal   Collection Time: 08/02/16  3:45 PM  Result Value Ref Range   Color, Urine YELLOW YELLOW   APPearance CLOUDY (A) CLEAR    Specific Gravity, Urine 1.015 1.005 - 1.030   pH 7.0 5.0 - 8.0   Glucose, UA NEGATIVE NEGATIVE mg/dL   Hgb urine dipstick LARGE (A) NEGATIVE   Bilirubin Urine NEGATIVE NEGATIVE   Ketones, ur NEGATIVE NEGATIVE mg/dL   Protein, ur NEGATIVE NEGATIVE mg/dL   Nitrite NEGATIVE NEGATIVE   Leukocytes, UA NEGATIVE NEGATIVE  Urine microscopic-add on     Status: Abnormal   Collection Time: 08/02/16  3:45 PM  Result Value Ref Range   Squamous Epithelial / LPF 0-5 (A) NONE SEEN   WBC, UA 0-5 0 - 5 WBC/hpf   RBC / HPF TOO NUMEROUS TO COUNT 0 - 5 RBC/hpf   Bacteria, UA FEW (A) NONE SEEN  Wet prep, genital     Status: Abnormal   Collection Time: 08/02/16  4:55 PM  Result Value Ref Range   Yeast Wet Prep HPF POC NONE SEEN NONE SEEN   Trich, Wet Prep NONE SEEN NONE SEEN   Clue Cells Wet Prep HPF POC PRESENT (A) NONE SEEN   WBC, Wet Prep HPF POC MANY (A) NONE SEEN   Sperm NONE SEEN   Fetal fibronectin     Status: None   Collection Time: 08/02/16  4:55 PM  Result Value Ref Range   Fetal Fibronectin NEGATIVE NEGATIVE    Imaging:  No results found.  MAU Course: SSE: Normal physiologic vaginal discharge, cervix appeared closed. No bleeding seen in vaginal vault or from/around cervix. Normal EFG.  SVE: Closed/long/high [place] to be performed UA showed large amounts of blood, urine culture sent NST: Reactive, no contractions FFN Neg [place]: +BV  MDM: SSE: No blood in vaginal vault or from os.  SVE: closed [place]: Normal, cervical length of 2.7cm. 6:40 PM - Spoke with Dr. Ronita Hipps, who agrees with discharge, follow up in office, has appt tomorrow. Plan of care reviewed with patient, including labs and tests ordered and medical treatment.   Assessment: 1. Bacterial vaginosis   2. Vaginal bleeding in pregnancy, second trimester     Plan: Discharge home in stable condition.  Preterm labor precautions and fetal kick counts Follow-up Information    LAVOIE,MARIE-LYNE, MD Follow up in 1 week(s).    Specialty:  Obstetrics and Gynecology Why:  Routine OB Care Contact information: [place] [address] [phone]             Medication List    TAKE these medications   metroNIDAZOLE 500 MG tablet Commonly known as:  FLAGYL Take 1 tablet (500 mg total) by mouth 2 (two) times daily.   multivitamin-prenatal 27-0.8  MG Tabs tablet Take 1 tablet by mouth daily at 12 noon.       [place], [place] 08/02/2016 7:00 PM

## 2016-08-02 NOTE — Discharge Instructions (Signed)
Vaginal Bleeding During Pregnancy, Second Trimester A small amount of bleeding (spotting) from the vagina is relatively common in pregnancy. It usually stops on its own. Various things can cause bleeding or spotting in pregnancy. Some bleeding may be related to the pregnancy, and some may not. Sometimes the bleeding is normal and is not a problem. However, bleeding can also be a sign of something serious. Be sure to tell your health care provider about any vaginal bleeding right away. Some possible causes of vaginal bleeding during the second trimester include:  Infection, inflammation, or growths on the cervix.   The placenta may be partially or completely covering the opening of the cervix inside the uterus (placenta previa).  The placenta may have separated from the uterus (abruption of the placenta).   You may be having early (preterm) labor.   The cervix may not be strong enough to keep a baby inside the uterus (cervical insufficiency).   Tiny cysts may have developed in the uterus instead of pregnancy tissue (molar pregnancy). HOME CARE INSTRUCTIONS  Watch your condition for any changes. The following actions may help to lessen any discomfort you are feeling:  Follow your health care provider's instructions for limiting your activity. If your health care provider orders bed rest, you may need to stay in bed and only get up to use the bathroom. However, your health care provider may allow you to continue light activity.  If needed, make plans for someone to help with your regular activities and responsibilities while you are on bed rest.  Keep track of the number of pads you use each day, how often you change pads, and how soaked (saturated) they are. Write this down.  Do not use tampons. Do not douche.  Do not have sexual intercourse or orgasms until approved by your health care provider.  If you pass any tissue from your vagina, save the tissue so you can show it to your  health care provider.  Only take over-the-counter or prescription medicines as directed by your health care provider.  Do not take aspirin because it can make you bleed.  Do not exercise or perform any strenuous activities or heavy lifting without your health care provider's permission.  Keep all follow-up appointments as directed by your health care provider. SEEK MEDICAL CARE IF:  You have any vaginal bleeding during any part of your pregnancy.  You have cramps or labor pains.  You have a fever, not controlled by medicine. SEEK IMMEDIATE MEDICAL CARE IF:   You have severe cramps in your back or belly (abdomen).  You have contractions.  You have chills.  You pass large clots or tissue from your vagina.  Your bleeding increases.  You feel light-headed or weak, or you have fainting episodes.  You are leaking fluid or have a gush of fluid from your vagina. MAKE SURE YOU:  Understand these instructions.  Will watch your condition.  Will get help right away if you are not doing well or get worse.   This information is not intended to replace advice given to you by your health care provider. Make sure you discuss any questions you have with your health care provider.   Document Released: 09/09/2005 Document Revised: 12/05/2013 Document Reviewed: 08/07/2013 Elsevier Interactive Patient Education 2016 Elsevier Inc. Bacterial Vaginosis Bacterial vaginosis is a vaginal infection that occurs when the normal balance of bacteria in the vagina is disrupted. It results from an overgrowth of certain bacteria. This is the most common vaginal  infection in women of childbearing age. Treatment is important to prevent complications, especially in pregnant women, as it can cause a premature delivery. CAUSES  Bacterial vaginosis is caused by an increase in harmful bacteria that are normally present in smaller amounts in the vagina. Several different kinds of bacteria can cause bacterial  vaginosis. However, the reason that the condition develops is not fully understood. RISK FACTORS Certain activities or behaviors can put you at an increased risk of developing bacterial vaginosis, including:  Having a new sex partner or multiple sex partners.  Douching.  Using an intrauterine device (IUD) for contraception. Women do not get bacterial vaginosis from toilet seats, bedding, swimming pools, or contact with objects around them. SIGNS AND SYMPTOMS  Some women with bacterial vaginosis have no signs or symptoms. Common symptoms include:  Grey vaginal discharge.  A fishlike odor with discharge, especially after sexual intercourse.  Itching or burning of the vagina and vulva.  Burning or pain with urination. DIAGNOSIS  Your health care provider will take a medical history and examine the vagina for signs of bacterial vaginosis. A sample of vaginal fluid may be taken. Your health care provider will look at this sample under a microscope to check for bacteria and abnormal cells. A vaginal pH test may also be done.  TREATMENT  Bacterial vaginosis may be treated with antibiotic medicines. These may be given in the form of a pill or a vaginal cream. A second round of antibiotics may be prescribed if the condition comes back after treatment. Because bacterial vaginosis increases your risk for sexually transmitted diseases, getting treated can help reduce your risk for chlamydia, gonorrhea, HIV, and herpes. HOME CARE INSTRUCTIONS   Only take over-the-counter or prescription medicines as directed by your health care provider.  If antibiotic medicine was prescribed, take it as directed. Make sure you finish it even if you start to feel better.  Tell all sexual partners that you have a vaginal infection. They should see their health care provider and be treated if they have problems, such as a mild rash or itching.  During treatment, it is important that you follow these  instructions:  Avoid sexual activity or use condoms correctly.  Do not douche.  Avoid alcohol as directed by your health care provider.  Avoid breastfeeding as directed by your health care provider. SEEK MEDICAL CARE IF:   Your symptoms are not improving after 3 days of treatment.  You have increased discharge or pain.  You have a fever. MAKE SURE YOU:   Understand these instructions.  Will watch your condition.  Will get help right away if you are not doing well or get worse. FOR MORE INFORMATION  Centers for Disease Control and Prevention, Division of STD Prevention: [employer]raud[employer] American Sexual Health Association (ASHA): www.ashastd.org    This information is not intended to replace advice given to you by your health care provider. Make sure you discuss any questions you have with your health care provider.   Document Released: 11/30/2005 Document Revised: 12/21/2014 Document Reviewed: 07/12/2013 Elsevier Interactive Patient Education [employer].

## 2016-08-02 NOTE — MAU Note (Signed)
Pt reports Pain on her R side that feels like cramping since this morning. Pt noticed some blood in her urine after lunchtime today. And then a second time along with a pea sized blood clot. No bleeding in MAU. +FM

## 2016-08-03 LAB — CULTURE, OB URINE: Culture: <10000 — AB

## 2016-08-03 LAB — GC/CHLAMYDIA PROBE AMP (~~LOC~~) NOT AT ARMC
CHLAMYDIA, DNA PROBE: NEGATIVE
NEISSERIA GONORRHEA: NEGATIVE

## 2016-09-02 ENCOUNTER — Inpatient Hospital Stay (HOSPITAL_COMMUNITY)
Admission: AD | Admit: 2016-09-02 | Discharge: 2016-09-02 | Disposition: A | Discharge status: Home / Self Care | Payer: BLUE CROSS/BLUE SHIELD | Source: Ambulatory Visit | Attending: Obstetrics and Gynecology | Admitting: Obstetrics and Gynecology

## 2016-09-02 ENCOUNTER — Encounter (HOSPITAL_COMMUNITY): Payer: Self-pay | Admitting: *Deleted

## 2016-09-02 ENCOUNTER — Inpatient Hospital Stay (HOSPITAL_COMMUNITY): Payer: BLUE CROSS/BLUE SHIELD

## 2016-09-02 DIAGNOSIS — S90811A Abrasion, right foot, initial encounter: Secondary | ICD-10-CM

## 2016-09-02 DIAGNOSIS — Z79899 Other long term (current) drug therapy: Secondary | ICD-10-CM | POA: Insufficient documentation

## 2016-09-02 DIAGNOSIS — T1490 Injury, unspecified: Secondary | ICD-10-CM | POA: Insufficient documentation

## 2016-09-02 DIAGNOSIS — Z3A31 31 weeks gestation of pregnancy: Secondary | ICD-10-CM | POA: Diagnosis not present

## 2016-09-02 DIAGNOSIS — W19XXXA Unspecified fall, initial encounter: Secondary | ICD-10-CM | POA: Diagnosis not present

## 2016-09-02 DIAGNOSIS — O9A213 Injury, poisoning and certain other consequences of external causes complicating pregnancy, third trimester: Secondary | ICD-10-CM | POA: Diagnosis not present

## 2016-09-02 DIAGNOSIS — O3443 Maternal care for other abnormalities of cervix, third trimester: Secondary | ICD-10-CM | POA: Insufficient documentation

## 2016-09-02 DIAGNOSIS — Z87891 Personal history of nicotine dependence: Secondary | ICD-10-CM | POA: Insufficient documentation

## 2016-09-02 DIAGNOSIS — Z9889 Other specified postprocedural states: Secondary | ICD-10-CM | POA: Diagnosis not present

## 2016-09-02 HISTORY — DX: Calculus of kidney: N20.0

## 2016-09-02 HISTORY — DX: Unspecified infectious disease: B99.9

## 2016-09-02 LAB — URINALYSIS, ROUTINE W REFLEX MICROSCOPIC
BILIRUBIN URINE: NEGATIVE
Glucose, UA: NEGATIVE mg/dL
Hgb urine dipstick: NEGATIVE
KETONES UR: NEGATIVE mg/dL
LEUKOCYTES UA: NEGATIVE
NITRITE: POSITIVE — AB
PROTEIN: NEGATIVE mg/dL
Specific Gravity, Urine: <1.005 — ABNORMAL LOW (ref 1.005–1.030)
pH: 6.5 (ref 5.0–8.0)

## 2016-09-02 LAB — URINE MICROSCOPIC-ADD ON: WBC UA: NONE SEEN WBC/hpf (ref 0–5)

## 2016-09-02 NOTE — MAU Note (Addendum)
[address] in [address], walking in to work.  Landed on abd.  Feeling crampy and nauseous. Denies bleeding. Abrasions noted on arms and rt outer ankle

## 2016-09-02 NOTE — MAU Provider Note (Signed)
History     CSN: [id number]  Arrival date and time: 09/02/16 [id number]   First Provider Initiated Contact with Patient 09/02/16 0913      Chief Complaint  Patient presents with  . Fall   HPI  [name] is a 30 y.o. female [phone] @ [redacted]w[redacted]d here in MAU following a fall. She was on her way to work this morning. She fell in a hole on the rd this morning around 0700; she tried catching the fall with her hands however she did hit her abdomen on the pavement.   + Anterior placenta   Denies vaginal bleeding + fetal movement She had some discomfort earlier in the middle of her abdomen; none now.   OB History    Gravida Para Term Preterm AB Living   4 2 2   1 2    SAB TAB Ectopic Multiple Live Births   1       2      Past Medical History:  Diagnosis Date  . Abnormal Pap smear of cervix    ok since LEEP to remove cancer cells  . Anxiety   . Depression 2003   good now  . HPV in female 2012  . Infection    UTI  . Kidney stone 2007  . Migraines    sensitivity to light    Past Surgical History:  Procedure Laterality Date  . 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 COLPOSCOPY  2012  . DILATION AND CURETTAGE OF UTERUS    . LEEP  2012  . mirena     pt was pregnant with iud in place    Family History  Problem Relation Age of Onset  . Hyperlipidemia Mother   . Hypertension Mother   . Other Mother     hx of substance abuse  . Lung cancer Maternal Grandfather   . Lung cancer Paternal Grandfather   . Hyperlipidemia Paternal Grandfather   . Other Father     hx substance abuse  . Hypertension Maternal Grandmother   . Hyperlipidemia Maternal Grandmother   . Heart disease Maternal Grandmother   . Diabetes Maternal Grandmother   . Kidney disease Maternal Grandmother     Social History  Substance Use Topics  . Smoking status: Former Smoker    Quit date: 02/05/2015  . Smokeless tobacco: Never Used  . Alcohol use No    Allergies: No  Known Allergies  Prescriptions Prior to Admission  Medication Sig Dispense Refill Last Dose  . calcium carbonate (TUMS - DOSED IN MG ELEMENTAL CALCIUM) 500 MG chewable tablet Chew 2 tablets by mouth daily as needed for indigestion or heartburn.   Past Week at Unknown time  . Prenatal Vit-Fe Fumarate-FA (MULTIVITAMIN-PRENATAL) 27-0.8 MG TABS tablet Take 1 tablet by mouth daily at 12 noon.   09/02/2016 at Unknown time  . PRESCRIPTION MEDICATION Place 1 application vaginally at bedtime. Prescription vaginal cream using for 5 days.   09/01/2016 at Unknown time   Results for orders placed or performed during the hospital encounter of 09/02/16 (from the past 48 hour(s))  Urinalysis, Routine w reflex microscopic (not at [facility])     Status: Abnormal   Collection Time: 09/02/16  8:50 AM  Result Value Ref Range   Color, Urine YELLOW YELLOW   APPearance CLEAR CLEAR   Specific Gravity, Urine <1.005 (L) 1.005 - 1.030   pH 6.5 5.0 - 8.0   Glucose, UA NEGATIVE NEGATIVE  mg/dL   Hgb urine dipstick NEGATIVE NEGATIVE   Bilirubin Urine NEGATIVE NEGATIVE   Ketones, ur NEGATIVE NEGATIVE mg/dL   Protein, ur NEGATIVE NEGATIVE mg/dL   Nitrite POSITIVE (A) NEGATIVE   Leukocytes, UA NEGATIVE NEGATIVE  Urine microscopic-add on     Status: Abnormal   Collection Time: 09/02/16  8:50 AM  Result Value Ref Range   Squamous Epithelial / LPF 0-5 (A) NONE SEEN   WBC, UA NONE SEEN 0 - 5 WBC/hpf   RBC / HPF 0-5 0 - 5 RBC/hpf   Bacteria, UA RARE (A) NONE SEEN    Review of Systems  Gastrointestinal: Negative for abdominal pain, nausea and vomiting.  Genitourinary: Negative for dysuria, flank pain, frequency, hematuria and urgency.   Physical Exam   Blood pressure 118/59, pulse 81, temperature 98.2 F (36.8 C), temperature source Oral, resp. rate 18, last menstrual period 10/08/2015.  Physical Exam  Constitutional: She is oriented to person, place, and time. She appears well-developed and well-nourished. No distress.    HENT:  Head: Normocephalic.  Eyes: Pupils are equal, round, and reactive to light.  Respiratory: Effort normal.  GI: Soft. She exhibits no distension. There is no tenderness. There is no rebound and no guarding.  Musculoskeletal: Normal range of motion.       Right foot: There is tenderness. There is normal range of motion and no swelling.       Feet:  Lateral 1 cm abrasion to the right foot, just below the right ankle. No swelling. + Tenderness.   Neurological: She is alert and oriented to person, place, and time.  Skin: Skin is warm. She is not diaphoretic.  Psychiatric: Her behavior is normal.   Fetal Tracing: Baseline: 125 bpm  Variability: Moderate  Accelerations: 15x15 Decelerations: none Toco: Quiet-none  MAU Course  Procedures  None  MDM  [place] NST  1055: Discussed preliminary [place] report with Dr. Ronita Hipps; anterior placenta with no sign of placenta previa or abruption. Patient is having no abdominal pain at this time and there is no sign of trauma to the patient's abdomen. Discussed fetal tracing; the patient has been on the fetal monitor since 0830. Ok to DC home per Dr. Ronita Hipps.   Assessment and Plan   A:  1. Abrasion of right foot excluding toe, initial encounter   2. Fall   3. [redacted] weeks gestation of pregnancy     P:  Discharge home in stable condition Strict return precautions discussed Follow up with Dr. Ronita Hipps.  Return to MAU if symptoms wosen RICE therapy for right foot.    Lezlie Lye, NP 09/02/2016 2:47 PM

## 2016-09-02 NOTE — MAU Note (Signed)
Urine in lab 

## 2016-09-02 NOTE — Discharge Instructions (Signed)
Abrasion An abrasion is a cut or scrape on the outer surface of your skin. An abrasion does not extend through all of the layers of your skin. It is important to care for your abrasion properly to prevent infection. CAUSES Most abrasions are caused by falling on or gliding across the ground or another surface. When your skin rubs on something, the outer and inner layer of skin rubs off.  SYMPTOMS A cut or scrape is the main symptom of this condition. The scrape may be bleeding, or it may appear red or pink. If there was an associated fall, there may be an underlying bruise. DIAGNOSIS An abrasion is diagnosed with a physical exam. TREATMENT Treatment for this condition depends on how large and deep the abrasion is. Usually, your abrasion will be cleaned with water and mild soap. This removes any dirt or debris that may be stuck. An antibiotic ointment may be applied to the abrasion to help prevent infection. A bandage (dressing) may be placed on the abrasion to keep it clean. You may also need a tetanus shot. HOME CARE INSTRUCTIONS Medicines  Take or apply medicines only as directed by your health care provider.  If you were prescribed an antibiotic ointment, finish all of it even if you start to feel better. Wound Care  Clean the wound with mild soap and water 2-3 times per day or as directed by your health care provider. Pat your wound dry with a clean towel. Do not rub it.  There are many different ways to close and cover a wound. Follow instructions from your health care provider about:  Wound care.  Dressing changes and removal.  Check your wound every day for signs of infection. Watch for:  Redness, swelling, or pain.  Fluid, blood, or pus. General Instructions  Keep the dressing dry as directed by your health care provider. Do not take baths, swim, use a hot tub, or do anything that would put your wound underwater until your health care provider approves.  If there is  swelling, raise (elevate) the injured area above the level of your heart while you are sitting or lying down.  Keep all follow-up visits as directed by your health care provider. This is important. SEEK MEDICAL CARE IF:  You received a tetanus shot and you have swelling, severe pain, redness, or bleeding at the injection site.  Your pain is not controlled with medicine.  You have increased redness, swelling, or pain at the site of your wound. SEEK IMMEDIATE MEDICAL CARE IF:  You have a red streak going away from your wound.  You have a fever.  You have fluid, blood, or pus coming from your wound.  You notice a bad smell coming from your wound or your dressing.   This information is not intended to replace advice given to you by your health care provider. Make sure you discuss any questions you have with your health care provider.   Document Released: 09/09/2005 Document Revised: 08/21/2015 Document Reviewed: 11/28/2014 Elsevier Interactive Patient Education 2016 [place] I Need to Know About Injuries During Pregnancy? Trauma is the most common cause of injury and death in pregnant women. This can also result in significant harm or death of the baby. Your baby is protected in the womb (uterus) by a sac filled with fluid (amniotic sac). Your baby can be harmed if there is direct, high-impact trauma to your abdomen and pelvis. This type of trauma can result in tearing of your  uterus, the placenta pulling away from the wall of the uterus (placenta abruption), or the amniotic sac breaking open (rupture of membranes). These injuries can decrease or stop the blood supply to your baby or cause you to go into labor earlier than expected. Minor falls and low-impact automobile accidents do not usually harm your baby, even if they do minimally harm you. WHAT KIND OF INJURIES CAN AFFECT MY PREGNANCY? The most common causes of injury or death to a baby include:  Falls. Falls are more  common in the second and third trimester of the pregnancy. Factors that increase your risk of falling include:  Increase in your weight.  The change in your center of gravity.  Tripping over an object that cannot be seen.  Increased looseness (laxity) of your ligaments resulting in less coordinated movements (you may feel clumsy).  Falling during high-risk activities like horseback riding or skiing.  Automobile accidents. It is important to wear your seat belt properly, with the lap belt below your abdomen, and always practice safe driving.  Domestic violence or assault.  Burns (Building services engineer). The most common causes of injury or death to the pregnant woman include:  Injuries that cause severe bleeding, shock, and loss of blood flow to major organs.  Head and neck injuries that result in severe brain or spinal damage.  Chest trauma that can cause direct injury to the heart and lungs or any injury that affects the area enclosed by the ribs. Trauma to this area can result in cardiorespiratory arrest. WHAT CAN I DO TO PROTECT MYSELF AND MY BABY FROM INJURY WHILE I AM PREGNANT?  Remove slippery rugs and loose objects on the floor that increase your risk of tripping.  Avoid walking on wet or slippery floors.  Wear comfortable shoes that have a good grip on the sole. Do not wear high-heeled shoes.  Always wear your seat belt properly, with the lap belt below your abdomen, and always practice safe driving. Do not ride on a motorcycle while pregnant.  Do not participate in high-impact activities or sports.  Avoid fires, starting fires, lifting heavy pots of boiling or hot liquids, and fixing electrical problems.  Only take over-the-counter or prescription medicines for pain, fever, or discomfort as directed by your health care provider.  Know your blood type and the father's blood type in case you develop vaginal bleeding or experience an injury for which a blood transfusion may  be necessary.  Call your local emergency services (911 in the [place] if you are a victim of domestic violence or assault. Spousal abuse can be a significant cause of trauma during pregnancy. For help and support, contact the [employer]. WHEN SHOULD I SEEK IMMEDIATE MEDICAL CARE?   You fall on your abdomen or experience any high-force accident or injury.  You have been assaulted (domestic or otherwise).  You have been in a car accident.  You develop vaginal bleeding.  You develop fluid leaking from the vagina.  You develop uterine contractions (pelvic cramping, pain, or significant low back pain).  You become weak or faint, or have uncontrolled vomiting after trauma.  You had a serious burn. This includes burns to the face, neck, hands, or genitals, or burns greater than the size of your palm anywhere else.  You develop neck stiffness or pain after a fall or from other trauma.  You develop a headache or vision problems after a fall or from other trauma.  You do not feel the baby  moving or the baby is not moving as much as before a fall or other trauma.   This information is not intended to replace advice given to you by your health care provider. Make sure you discuss any questions you have with your health care provider.   Document Released: 01/07/2005 Document Revised: 12/21/2014 Document Reviewed: 09/06/2013 Elsevier Interactive Patient Education [employer].

## 2016-09-03 LAB — CULTURE, OB URINE: SPECIAL REQUESTS: NORMAL

## 2016-10-05 LAB — OB RESULTS CONSOLE GBS: GBS: NEGATIVE

## 2016-10-21 ENCOUNTER — Inpatient Hospital Stay (HOSPITAL_COMMUNITY)
Admission: AD | Admit: 2016-10-21 | Discharge: 2016-10-23 | DRG: 775 | Disposition: A | Discharge status: Home / Self Care | Payer: BLUE CROSS/BLUE SHIELD | Source: Ambulatory Visit | Attending: Obstetrics | Admitting: Obstetrics

## 2016-10-21 ENCOUNTER — Encounter (HOSPITAL_COMMUNITY): Payer: Self-pay

## 2016-10-21 DIAGNOSIS — Z833 Family history of diabetes mellitus: Secondary | ICD-10-CM | POA: Diagnosis not present

## 2016-10-21 DIAGNOSIS — Z6841 Body Mass Index (BMI) 40.0 and over, adult: Secondary | ICD-10-CM | POA: Diagnosis not present

## 2016-10-21 DIAGNOSIS — O99214 Obesity complicating childbirth: Secondary | ICD-10-CM | POA: Diagnosis present

## 2016-10-21 DIAGNOSIS — O9962 Diseases of the digestive system complicating childbirth: Secondary | ICD-10-CM | POA: Diagnosis present

## 2016-10-21 DIAGNOSIS — D225 Melanocytic nevi of trunk: Secondary | ICD-10-CM | POA: Diagnosis present

## 2016-10-21 DIAGNOSIS — K219 Gastro-esophageal reflux disease without esophagitis: Secondary | ICD-10-CM | POA: Diagnosis present

## 2016-10-21 DIAGNOSIS — Z87891 Personal history of nicotine dependence: Secondary | ICD-10-CM

## 2016-10-21 DIAGNOSIS — O4290 Premature rupture of membranes, unspecified as to length of time between rupture and onset of labor, unspecified weeks of gestation: Secondary | ICD-10-CM | POA: Diagnosis present

## 2016-10-21 DIAGNOSIS — O4202 Full-term premature rupture of membranes, onset of labor within 24 hours of rupture: Principal | ICD-10-CM | POA: Diagnosis present

## 2016-10-21 DIAGNOSIS — Z8249 Family history of ischemic heart disease and other diseases of the circulatory system: Secondary | ICD-10-CM

## 2016-10-21 DIAGNOSIS — Z3A38 38 weeks gestation of pregnancy: Secondary | ICD-10-CM | POA: Diagnosis not present

## 2016-10-21 LAB — CBC
HCT: 35.1 % — ABNORMAL LOW (ref 36.0–46.0)
Hemoglobin: 12.5 g/dL (ref 12.0–15.0)
MCH: 30.9 pg (ref 26.0–34.0)
MCHC: 35.6 g/dL (ref 30.0–36.0)
MCV: 86.7 fL (ref 78.0–100.0)
Platelets: 241 10*3/uL (ref 150–400)
RBC: 4.05 MIL/uL (ref 3.87–5.11)
RDW: 13.2 % (ref 11.5–15.5)
WBC: 16 10*3/uL — ABNORMAL HIGH (ref 4.0–10.5)

## 2016-10-21 LAB — TYPE AND SCREEN
ABO/RH(D): O POS
Antibody Screen: NEGATIVE

## 2016-10-21 LAB — ABO/RH: ABO/RH(D): O POS

## 2016-10-21 MED ORDER — LACTATED RINGERS IV SOLN
500.0000 mL | Freq: Once | INTRAVENOUS | Status: AC
  Administered 2016-10-22: 500 mL via INTRAVENOUS

## 2016-10-21 MED ORDER — ACETAMINOPHEN 325 MG PO TABS: 650.0000 mg | ORAL_TABLET | ORAL | Status: DC | PRN

## 2016-10-21 MED ORDER — FENTANYL 2.5 MCG/ML BUPIVACAINE 1/10 % EPIDURAL INFUSION (WH - ANES)
14.0000 mL/h | INTRAMUSCULAR | Status: DC | PRN
  Administered 2016-10-22: 13.5 mL/h via EPIDURAL
  Administered 2016-10-22: 14 mL/h via EPIDURAL
  Filled 2016-10-21: qty 100

## 2016-10-21 MED ORDER — OXYTOCIN 40 UNITS IN LACTATED RINGERS INFUSION - SIMPLE MED
2.5000 [IU]/h | INTRAVENOUS | Status: DC
  Filled 2016-10-21: qty 1000

## 2016-10-21 MED ORDER — OXYTOCIN 40 UNITS IN LACTATED RINGERS INFUSION - SIMPLE MED
1.0000 m[IU]/min | INTRAVENOUS | Status: DC
  Administered 2016-10-21: 2 m[IU]/min via INTRAVENOUS

## 2016-10-21 MED ORDER — PHENYLEPHRINE 40 MCG/ML (10ML) SYRINGE FOR IV PUSH (FOR BLOOD PRESSURE SUPPORT)
80.0000 ug | PREFILLED_SYRINGE | INTRAVENOUS | Status: DC | PRN
Start: 2016-10-21 — End: 2016-10-22
  Filled 2016-10-21: qty 10

## 2016-10-21 MED ORDER — EPHEDRINE 5 MG/ML INJ: 10.0000 mg | INTRAVENOUS | Status: DC | PRN

## 2016-10-21 MED ORDER — LACTATED RINGERS IV SOLN: 500.0000 mL | INTRAVENOUS | Status: DC | PRN

## 2016-10-21 MED ORDER — LIDOCAINE HCL (PF) 1 % IJ SOLN
30.0000 mL | INTRAMUSCULAR | Status: DC | PRN
  Administered 2016-10-22: 30 mL via SUBCUTANEOUS
  Filled 2016-10-21: qty 30

## 2016-10-21 MED ORDER — TERBUTALINE SULFATE 1 MG/ML IJ SOLN: 0.2500 mg | Freq: Once | INTRAMUSCULAR | Status: DC | PRN

## 2016-10-21 MED ORDER — OXYTOCIN BOLUS FROM INFUSION
500.0000 mL | Freq: Once | INTRAVENOUS | Status: DC
  Administered 2016-10-22: 500 mL via INTRAVENOUS

## 2016-10-21 MED ORDER — SOD CITRATE-CITRIC ACID 500-334 MG/5ML PO SOLN: 30.0000 mL | ORAL | Status: DC | PRN

## 2016-10-21 MED ORDER — EPHEDRINE 5 MG/ML INJ
10.0000 mg | INTRAVENOUS | Status: DC | PRN
Start: 2016-10-21 — End: 2016-10-22

## 2016-10-21 MED ORDER — DIPHENHYDRAMINE HCL 50 MG/ML IJ SOLN: 12.5000 mg | INTRAMUSCULAR | Status: DC | PRN

## 2016-10-21 MED ORDER — PHENYLEPHRINE 40 MCG/ML (10ML) SYRINGE FOR IV PUSH (FOR BLOOD PRESSURE SUPPORT): 80.0000 ug | PREFILLED_SYRINGE | INTRAVENOUS | Status: DC | PRN

## 2016-10-21 MED ORDER — OXYCODONE-ACETAMINOPHEN 5-325 MG PO TABS: 1.0000 | ORAL_TABLET | ORAL | Status: DC | PRN

## 2016-10-21 MED ORDER — LACTATED RINGERS IV SOLN
INTRAVENOUS | Status: DC
  Administered 2016-10-21 (×2): via INTRAVENOUS

## 2016-10-21 MED ORDER — OXYCODONE-ACETAMINOPHEN 5-325 MG PO TABS: 2.0000 | ORAL_TABLET | ORAL | Status: DC | PRN

## 2016-10-21 NOTE — Anesthesia Pain Management Evaluation Note (Signed)
  CRNA Pain Management Visit Note  Patient: Jenny Anderson, 30 y.o., female  "Hello I am a member of the anesthesia team at [facility]. We have an anesthesia team available at all times to provide care throughout the hospital, including epidural management and anesthesia for C-section. I don't know your plan for the delivery whether it a natural birth, water birth, IV sedation, nitrous supplementation, doula or epidural, but we want to meet your pain goals."   1.Was your pain managed to your expectations on prior hospitalizations?   Yes   2.What is your expectation for pain management during this hospitalization?     Epidural  3.How can we help you reach that goal? unsure  Record the patient's initial score and the patient's pain goal.   Pain: 0  Pain Goal: 6 [facility] wants you to be able to say your pain was always managed very well.  Casimer Lanius 10/21/2016

## 2016-10-21 NOTE — H&P (Signed)
OB ADMISSION/ HISTORY & PHYSICAL:  Admission Date: 10/21/2016  4:01 PM  Admit Diagnosis: PROM with prodromal contraction pattern  Jenny Anderson is a 30 y.o. female presenting for admit with SROM at 0800 this am. . Sent from office with evaluation by Modesta Messing and consult with Dr Benjie Karvonen.  Prenatal History: [id number]   EDC : 11/02/2016, by Other Basis  Prenatal care at [facility]  Primary Ob Provider: Renato Battles CNM Prenatal course complicated by hx LEEP  Prenatal Labs: ABO, Rh:  O positive Antibody:  Negative Rubella:   Immune RPR:   NR HBsAg:   negative HIV:   NR GTT: NL GBS:   negative  Medical / Surgical History :  Past medical history:  Past Medical History:  Diagnosis Date  . Abnormal Pap smear of cervix    ok since LEEP to remove cancer cells  . Anxiety   . Depression 2003   good now  . HPV in female 2012  . Infection    UTI  . Kidney stone 2007  . Migraines    sensitivity to light     Past surgical history:  Past Surgical History:  Procedure Laterality Date  . 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 COLPOSCOPY  2012  . DILATION AND CURETTAGE OF UTERUS    . LEEP  2012  . mirena     pt was pregnant with iud in place    Family History:  Family History  Problem Relation Age of Onset  . Hyperlipidemia Mother   . Hypertension Mother   . Other Mother     hx of substance abuse  . Lung cancer Maternal Grandfather   . Lung cancer Paternal Grandfather   . Hyperlipidemia Paternal Grandfather   . Other Father     hx substance abuse  . Hypertension Maternal Grandmother   . Hyperlipidemia Maternal Grandmother   . Heart disease Maternal Grandmother   . Diabetes Maternal Grandmother   . Kidney disease Maternal Grandmother      Social History:  reports that she quit smoking about 20 months ago. She has never used smokeless tobacco. She reports that she does not drink alcohol or use drugs.   Allergies: Patient  has no known allergies.    Current Medications at time of admission:  Prior to Admission medications   Medication Sig Start Date End Date Taking? Authorizing Provider  calcium carbonate (TUMS - DOSED IN MG ELEMENTAL CALCIUM) 500 MG chewable tablet Chew 2 tablets by mouth daily as needed for indigestion or heartburn.    Historical Provider, MD  Prenatal Vit-Fe Fumarate-FA (MULTIVITAMIN-PRENATAL) 27-0.8 MG TABS tablet Take 1 tablet by mouth daily at 12 noon.    Historical Provider, MD  PRESCRIPTION MEDICATION Place 1 application vaginally at bedtime. Prescription vaginal cream using for 5 days.    Historical Provider, MD   Review of Systems: Active FM Aware orf occasional ctx LOF  / SROM @ 0800 bloody show absent  Physical Exam: VS: Blood pressure 129/88, pulse (!) 126, temperature 97.9 F (36.6 C), resp. rate 20, height 5\' 3"  (1.6 m), weight 113.4 kg (250 lb), last menstrual period 10/08/2015.  General: alert and oriented, appears calm and comfortable Heart: RRR Lungs: Clear lung fields Abdomen: Gravid, soft and non-tender, non-distended / uterus: gravid with EFW 7 pounds Extremities: trace dependent edema  Genitalia / VE:  deferred until start of pitocin  FHR: baseline rate 120 / variability moderate /  accelerations + / no decelerations TOCO: 6-9 minutes mild  Assessment: [redacted] weeks gestation PROM with prodromal ctx pattern FHR category 1   Plan:  Admit Pitocin augmentation Epidural for pain management  Ok regular diet now then initiate pitocin induction  Artelia Laroche CNM, MSN, [facility] 10/21/2016, 5:16 PM

## 2016-10-22 ENCOUNTER — Encounter (HOSPITAL_COMMUNITY): Payer: Self-pay | Admitting: Anesthesiology

## 2016-10-22 ENCOUNTER — Inpatient Hospital Stay (HOSPITAL_COMMUNITY): Payer: BLUE CROSS/BLUE SHIELD | Admitting: Anesthesiology

## 2016-10-22 LAB — CBC
HCT: 33.1 % — ABNORMAL LOW (ref 36.0–46.0)
Hemoglobin: 11.6 g/dL — ABNORMAL LOW (ref 12.0–15.0)
MCH: 30.9 pg (ref 26.0–34.0)
MCHC: 35 g/dL (ref 30.0–36.0)
MCV: 88.3 fL (ref 78.0–100.0)
Platelets: 250 10*3/uL (ref 150–400)
RBC: 3.75 MIL/uL — ABNORMAL LOW (ref 3.87–5.11)
RDW: 13.4 % (ref 11.5–15.5)
WBC: 21.1 10*3/uL — ABNORMAL HIGH (ref 4.0–10.5)

## 2016-10-22 LAB — RPR: RPR Ser Ql: NONREACTIVE

## 2016-10-22 MED ORDER — ACETAMINOPHEN 325 MG PO TABS: 650.0000 mg | ORAL_TABLET | ORAL | Status: DC | PRN

## 2016-10-22 MED ORDER — IBUPROFEN 600 MG PO TABS
600.0000 mg | ORAL_TABLET | Freq: Four times a day (QID) | ORAL | Status: DC
  Administered 2016-10-22 – 2016-10-23 (×5): 600 mg via ORAL
  Filled 2016-10-22 (×5): qty 1

## 2016-10-22 MED ORDER — LIDOCAINE HCL (PF) 1 % IJ SOLN
INTRAMUSCULAR | Status: DC | PRN
  Administered 2016-10-22 (×2): 4 mL via EPIDURAL

## 2016-10-22 MED ORDER — SENNOSIDES-DOCUSATE SODIUM 8.6-50 MG PO TABS
2.0000 | ORAL_TABLET | ORAL | Status: DC
  Administered 2016-10-22: 2 via ORAL
  Filled 2016-10-22: qty 2

## 2016-10-22 MED ORDER — BACITRACIN-NEOMYCIN-POLYMYXIN 400-5-5000 EX OINT
TOPICAL_OINTMENT | Freq: Three times a day (TID) | CUTANEOUS | Status: DC
  Administered 2016-10-22 (×3): via TOPICAL
  Filled 2016-10-22 (×7): qty 1

## 2016-10-22 MED ORDER — SILVER NITRATE-POT NITRATE 75-25 % EX MISC
1.0000 "application " | Freq: Once | CUTANEOUS | Status: DC
  Filled 2016-10-22: qty 1

## 2016-10-22 MED ORDER — WITCH HAZEL-GLYCERIN EX PADS: 1.0000 "application " | MEDICATED_PAD | CUTANEOUS | Status: DC | PRN

## 2016-10-22 MED ORDER — BENZOCAINE-MENTHOL 20-0.5 % EX AERO
1.0000 "application " | INHALATION_SPRAY | CUTANEOUS | Status: DC | PRN
  Administered 2016-10-22: 1 via TOPICAL
  Filled 2016-10-22 (×2): qty 56

## 2016-10-22 MED ORDER — MISOPROSTOL 200 MCG PO TABS
ORAL_TABLET | ORAL | Status: AC
  Administered 2016-10-22: 800 ug via RECTAL
  Filled 2016-10-22: qty 4

## 2016-10-22 MED ORDER — DIBUCAINE 1 % RE OINT
1.0000 "application " | TOPICAL_OINTMENT | RECTAL | Status: DC | PRN
  Filled 2016-10-22: qty 28

## 2016-10-22 MED ORDER — SIMETHICONE 80 MG PO CHEW: 80.0000 mg | CHEWABLE_TABLET | ORAL | Status: DC | PRN

## 2016-10-22 MED ORDER — COCONUT OIL OIL
1.0000 "application " | TOPICAL_OIL | Status: DC | PRN
  Filled 2016-10-22: qty 120

## 2016-10-22 MED ORDER — MISOPROSTOL 200 MCG PO TABS
800.0000 ug | ORAL_TABLET | Freq: Once | ORAL | Status: AC
  Administered 2016-10-22: 800 ug via RECTAL

## 2016-10-22 NOTE — Anesthesia Preprocedure Evaluation (Addendum)
Anesthesia Evaluation  Patient identified by MRN, date of birth, ID band Patient awake    Reviewed: Allergy & Precautions, NPO status , Patient's Chart, lab work & pertinent test results  Airway Mallampati: III       Dental no notable dental hx. (+) Teeth Intact   Pulmonary neg pulmonary ROS, former smoker,    Pulmonary exam normal breath sounds clear to auscultation       Cardiovascular negative cardio ROS Normal cardiovascular exam Rhythm:Regular Rate:Normal     Neuro/Psych  Headaches, PSYCHIATRIC DISORDERS Anxiety Depression    GI/Hepatic Neg liver ROS, GERD  Medicated and Controlled,  Endo/Other  Morbid obesity  Renal/GU Renal diseaseHx/o renal calculi  negative genitourinary   Musculoskeletal negative musculoskeletal ROS (+)   Abdominal (+) + obese,   Peds  Hematology negative hematology ROS (+)   Anesthesia Other Findings Pierced nose  Reproductive/Obstetrics (+) Pregnancy PROM 38 3/7 weeks HPV                           Lab Results  Component Value Date   WBC 16.0 (H) 10/21/2016   HGB 12.5 10/21/2016   HCT 35.1 (L) 10/21/2016   MCV 86.7 10/21/2016   PLT 241 10/21/2016    Anesthesia Physical Anesthesia Plan  ASA: III  Anesthesia Plan: Epidural   Post-op Pain Management:    Induction:   Airway Management Planned: Natural Airway  Additional Equipment:   Intra-op Plan:   Post-operative Plan:   Informed Consent: I have reviewed the patients History and Physical, chart, labs and discussed the procedure including the risks, benefits and alternatives for the proposed anesthesia with the patient or authorized representative who has indicated his/her understanding and acceptance.   Dental advisory given  Plan Discussed with: Anesthesiologist  Anesthesia Plan Comments:         Anesthesia Quick Evaluation

## 2016-10-22 NOTE — Anesthesia Procedure Notes (Signed)
Epidural Patient location during procedure: OB Start time: 10/22/2016 1:41 AM  Staffing Anesthesiologist: Josephine Igo Performed: anesthesiologist   Preanesthetic Checklist Completed: patient identified, site marked, surgical consent, pre-op evaluation, timeout performed, IV checked, risks and benefits discussed and monitors and equipment checked  Epidural Patient position: sitting Prep: site prepped and draped and DuraPrep Patient monitoring: continuous pulse ox and blood pressure Approach: midline Location: L3-L4 Injection technique: LOR air  Needle:  Needle type: Tuohy  Needle gauge: 17 G Needle length: 9 cm and 9 Needle insertion depth: 7 cm Catheter type: closed end flexible Catheter size: 19 Gauge Catheter at skin depth: 12 cm Test dose: negative and Other  Assessment Events: blood not aspirated, injection not painful, no injection resistance, negative IV test and no paresthesia  Additional Notes Patient identified. Risks and benefits discussed including failed block, incomplete  Pain control, post dural puncture headache, nerve damage, paralysis, blood pressure Changes, nausea, vomiting, reactions to medications-both toxic and allergic and post Partum back pain. All questions were answered. Patient expressed understanding and wished to proceed. Sterile technique was used throughout procedure. Epidural site was Dressed with sterile barrier dressing. No paresthesias, signs of intravascular injection Or signs of intrathecal spread were encountered.  Patient was more comfortable after the epidural was dosed. Please see RN's note for documentation of vital signs and FHR which are stable.

## 2016-10-22 NOTE — Progress Notes (Signed)
MOB was referred for history of depression/anxiety. * Referral screened out by Clinical Social Worker because none of the following criteria appear to apply: ~ History of anxiety/depression during this pregnancy, or of post-partum depression. ~ Diagnosis of anxiety and/or depression within last 3 years OR * MOB's symptoms currently being treated with medication and/or therapy.  CSW completed chart review and there was no indication of MH concerns prenatally.    Please contact the Clinical Social Worker if needs arise, or if MOB requests.  Laurey Arrow, MSW, LCSW Clinical Social Work [phone]

## 2016-10-22 NOTE — Progress Notes (Signed)
S:  Some pressure   O:  VS: Blood pressure 132/83, pulse 94, temperature 97.4 F (36.3 C), temperature source Oral, resp. rate 16, height 5\' 3"  (1.6 m), weight 113.4 kg (250 lb), last menstrual period 10/08/2015, SpO2 100 %.        FHR : baseline 130 / variability moderate / accelerations + / variable intermittent decelerations        Toco: contractions every 2-3 minutes / moderate-strong / pitocin at 10 mu/min        Cervix : 10/100% vtx +3        Membranes: clear with show  A: second stage labor     FHR category 2  P: Initiate active second stage of labor     Anticipate SVB   Artelia Laroche CNM, MSN, [facility] 10/22/2016, [id number]

## 2016-10-22 NOTE — Anesthesia Postprocedure Evaluation (Signed)
Anesthesia Post Note  Patient: Jenny Anderson  Procedure(s) Performed: * No procedures listed *  Patient location during evaluation: Mother Baby Anesthesia Type: Epidural Level of consciousness: awake and alert, oriented and patient cooperative Pain management: pain level controlled Vital Signs Assessment: post-procedure vital signs reviewed and stable Respiratory status: spontaneous breathing, nonlabored ventilation and respiratory function stable Cardiovascular status: stable Postop Assessment: no headache, no backache, patient able to bend at knees and no signs of nausea or vomiting Anesthetic complications: no     Last Vitals:  Vitals:   10/22/16 0733 10/22/16 0750  BP: 109/70 116/72  Pulse: 79 71  Resp: 14 18  Temp:  36.8 C    Last Pain: 0  Pain Goal:  4               Trellis Guirguis

## 2016-10-22 NOTE — Lactation Note (Signed)
This note was copied from a baby's chart. Lactation Consultation Note  Patient Name: Jenny Anderson Today's Date: 10/22/2016 Reason for consult: Initial assessment   Initial visit at 7 hours of life. Infant has already fed twice & Mom reports good latches. Mom is a P3 who nursed her 1st child for 3 months (and had low milk supply) . Her 2nd child was fed w/breast milk for 9 months (Mom lactated for 6 months, but had enough stored milk for an additional 3 months). Mom had an abundant supply w/the 2nd child, which may have been because she had done antenatal milk expression. Mom reports that with her 2nd child, she could feed infant at the breast & then pump off an additional 6 oz from each breast. Mom has done antenatal milk expression this time also. She has been pumping for about 1 week & already has about 3 oz of colostrum saved.   Mom educated about initial newborn behavior. Mom made aware of O/P services, breastfeeding support groups, community resources, and our phone # for post-discharge questions. Mom has no questions at this time.   Matthias Hughs [facility] 10/22/2016, 11:41 AM

## 2016-10-22 NOTE — Progress Notes (Signed)
Patient ID: Jenny Anderson, female   DOB: 03-Sep-1986, 30 y.o.   MRN: [id number] INTERVAL NOTE: PPD # 0 S/P SVD  S:   Sitting in bed, min cramping, (+) voids, small bleed, denies HA/NV/dizziness  Very happy and pleased with birth experience.  O:   VSS, AAO x 3, NAD  U-even  Scant lochia  A / P:   PPD #0  Stable post partum  Routine PP orders  Graceann Congress, MSN, CNM 10/22/2016, 10:19 AM

## 2016-10-23 MED ORDER — BACITRACIN-NEOMYCIN-POLYMYXIN 400-5-5000 EX OINT: TOPICAL_OINTMENT | Freq: Three times a day (TID) | CUTANEOUS | 0 refills | Status: DC

## 2016-10-23 MED ORDER — IBUPROFEN 600 MG PO TABS: 600.0000 mg | ORAL_TABLET | Freq: Four times a day (QID) | ORAL | 0 refills | Status: DC

## 2016-10-23 MED ORDER — COCONUT OIL OIL: 1.0000 "application " | TOPICAL_OIL | 0 refills | Status: DC | PRN

## 2016-10-23 NOTE — Discharge Instructions (Signed)
Breast Pumping Tips °If you are breastfeeding, there may be times when you cannot feed your baby directly. Returning to work or going on a trip are common examples. Pumping allows you to store breast milk and feed it to your baby later.  °You may not get much milk when you first start to pump. Your breasts should start to make more after a few days. If you pump at the times you usually feed your baby, you may be able to keep making enough milk to feed your baby without also using formula. The more often you pump, the more milk you will produce.  °WHEN SHOULD I PUMP?  °· You can begin to pump soon after delivery. However, some experts recommend waiting about 4 weeks before giving your infant a bottle to make sure breastfeeding is going well.  °· If you plan to return to work, begin pumping a few weeks before. This will help you develop techniques that work best for you. It also lets you build up a supply of breast milk.   °· When you are with your infant, feed on demand and pump after each feeding.   °· When you are away from your infant for several hours, pump for about 15 minutes every 2-3 hours. Pump both breasts at the same time if you can.   °· If your infant has a formula feeding, make sure to pump around the same time.     °· If you drink any alcohol, wait 2 hours before pumping.   °HOW DO I PREPARE TO PUMP? °Your let-down reflex is the natural reaction to stimulation that makes your breast milk flow. It is easier to stimulate this reflex when you are relaxed. Find relaxation techniques that work for you. If you have difficulty with your let-down reflex, try these methods:  °· Smell one of your infant's blankets or an item of clothing.   °· Look at a picture or video of your infant.   °· Sit in a quiet, private space.   °· Massage the breast you plan to pump.   °· Place soothing warmth on the breast.   °· Play relaxing music.   °WHAT ARE SOME GENERAL BREAST PUMPING TIPS? °· Wash your hands before you pump. You  do not need to wash your nipples or breasts. °· There are three ways to pump. °¨ You can use your hand to massage and compress your breast. °¨ You can use a handheld manual pump. °¨ You can use an electric pump.   °· Make sure the suction cup (flange) on the breast pump is the right size. Place the flange directly over the nipple. If it is the wrong size or placed the wrong way, it may be painful and cause nipple damage.   °· If pumping is uncomfortable, apply a small amount of purified or modified lanolin to your nipple and areola. °· If you are using an electric pump, adjust the speed and suction power to be more comfortable. °· If pumping is painful or if you are not getting very much milk, you may need a different type of pump. A lactation consultant can help you determine what type of pump to use.   °· Keep a full water bottle near you at all times. Drinking lots of fluid helps you make more milk.  °· You can store your milk to use later. Pumped breast milk can be stored in a sealable, sterile container or plastic bag. Label all stored breast milk with the date you pumped it. °¨ Milk can stay out at room temperature for up to 8 hours. °¨   You can store your milk in the refrigerator for up to 8 days.  You can store your milk in the freezer for 3 months. Thaw frozen milk using warm water. Do not put it in the microwave.  Do not smoke. Smoking can lower your milk supply and harm your infant. If you need help quitting, ask your health care provider to recommend a program.  [place] A LACTATION CONSULTANT?  You are having trouble pumping.  You are concerned that you are not making enough milk.  You have nipple pain, soreness, or redness.  You want to use birth control. Birth control pills may lower your milk supply. Talk to your health care provider about your options.   This information is not intended to replace advice given to you by your health care provider.  Make sure you discuss any questions you have with your health care provider.   Document Released: 05/20/2010 Document Revised: 12/05/2013 Document Reviewed: 09/22/2013 Elsevier Interactive Patient Education [employer].

## 2016-10-23 NOTE — Progress Notes (Signed)
PPD # 1 SVD Information for the patient's newborn:  Deshawnda, Mechem [place] [id number]  female    breast feeding   Baby name: Harrietta Guardian:  Reports feeling well, desires DC home             Tolerating po/ No nausea or vomiting             Bleeding is light             Pain controlled with ibuprofen (OTC)             Up ad lib / ambulatory / voiding without difficulties        O:  A & O x 3, in no apparent distress              VS:  Vitals:   10/22/16 0905 10/22/16 1300 10/22/16 1752 10/22/16 2053  BP: 130/63 (!) 118/55 127/64 117/71  Pulse: 66 70 69 70  Resp: 18 16 18 18   Temp: 98.2 F (36.8 C) 97.8 F (36.6 C) 98.1 F (36.7 C) 97.8 F (36.6 C)  TempSrc: Oral Oral Oral Oral  SpO2:    99%  Weight:      Height:        LABS:  Recent Labs  10/21/16 1656 10/22/16 0634  WBC 16.0* 21.1*  HGB 12.5 11.6*  HCT 35.1* 33.1*  PLT 241 250    Blood type: --/--/O POS, O POS (11/08 1656)  Rubella: Immune (04/24 0000)   I&O: I/O last 3 completed shifts: In: -  Out: [id number] [Urine:300; Blood:445]          No intake/output data recorded.  Lungs: Clear and unlabored  Heart: regular rate and rhythm / no murmurs  Abdomen: soft, non-tender, non-distended             Fundus: firm, non-tender, U-1  Perineum: intact, no edema  Lochia: scant  Extremities: no edema, no calf pain or tenderness    A/P: PPD # 1 30 y.o., [id number]   Principal Problem:   Postpartum care following vaginal delivery (11/9) Active Problems:   PROM (premature rupture of membranes)   SVD (spontaneous vaginal delivery)   Doing well - stable status  Routine post partum orders  DC home today    Juliene Pina, MSN, CNM 10/23/2016, 9:24 AM

## 2016-10-23 NOTE — Lactation Note (Signed)
This note was copied from a baby's chart. Lactation Consultation Note: Experienced BF mom. Reports baby has been nursing well, nipples are slightly tender. Encouraged to rub EBM into nipples after nursing. Has Medela Freestyle pump at home. Has already been pumping prior to delivery due to history of low milk supply with first baby. No questions at present. Reviewed our phone number, OP appointments and BFSG as resources for support after DC.   Patient Name: Jenny Anderson [id number] Date: 10/23/2016 Reason for consult: Follow-up assessment   Maternal Data Formula Feeding for Exclusion: No Has patient been taught Hand Expression?: Yes Does the patient have breastfeeding experience prior to this delivery?: Yes  Feeding Length of feed: 30 min  LATCH Score/Interventions                      Lactation Tools Discussed/Used WIC Program: No   Consult Status Consult Status: Complete    Truddie Crumble 10/23/2016, 2:20 PM

## 2016-10-23 NOTE — Discharge Summary (Signed)
Obstetric Discharge Summary Reason for Admission: induction of labor and rupture of membranes Prenatal Procedures: ultrasound Intrapartum Procedures: spontaneous vaginal delivery Postpartum Procedures: excision of perineal mole Complications-Operative and Postpartum: none Hemoglobin  Date Value Ref Range Status  10/22/2016 11.6 (L) 12.0 - 15.0 g/dL Final   HCT  Date Value Ref Range Status  10/22/2016 33.1 (L) 36.0 - 46.0 % Final    Physical Exam:  General: alert, cooperative and no distress Lochia: appropriate Uterine Fundus: firm Incision: healing well DVT Evaluation: No cords or calf tenderness. No significant calf/ankle edema.  Discharge Diagnoses: Term Pregnancy-delivered  Discharge Information: Date: 10/23/2016 Activity: pelvic rest Diet: routine Medications: PNV and Ibuprofen Condition: stable Instructions: refer to practice specific booklet Discharge to: home Follow-up Information    Artelia Laroche, CNM. Schedule an appointment as soon as possible for a visit in 6 week(s).   Specialty:  Obstetrics and Gynecology Why:  Postpartum visit Contact information: [place] [address] [phone]           Newborn Data: Live born female [place] Birth Weight: 7 lb 0.5 oz (3190 g) APGAR: 8, 9  Home with mother.  Juliene Pina 10/23/2016, 9:33 AM

## 2017-10-29 ENCOUNTER — Other Ambulatory Visit (HOSPITAL_COMMUNITY): Payer: Self-pay | Admitting: General Surgery

## 2017-11-11 ENCOUNTER — Ambulatory Visit: Payer: BLUE CROSS/BLUE SHIELD | Admitting: Registered"

## 2017-11-12 ENCOUNTER — Ambulatory Visit (HOSPITAL_COMMUNITY): Admission: RE | Admit: 2017-11-12 | Payer: BLUE CROSS/BLUE SHIELD | Source: Ambulatory Visit

## 2017-11-12 ENCOUNTER — Ambulatory Visit (HOSPITAL_COMMUNITY): Payer: BLUE CROSS/BLUE SHIELD | Attending: General Surgery

## 2017-11-18 ENCOUNTER — Ambulatory Visit: Payer: BLUE CROSS/BLUE SHIELD | Admitting: Skilled Nursing Facility1

## 2017-11-26 ENCOUNTER — Encounter: Payer: Self-pay | Attending: General Surgery | Admitting: Skilled Nursing Facility1

## 2017-11-26 DIAGNOSIS — Z713 Dietary counseling and surveillance: Secondary | ICD-10-CM | POA: Insufficient documentation

## 2017-11-26 DIAGNOSIS — F17200 Nicotine dependence, unspecified, uncomplicated: Secondary | ICD-10-CM | POA: Insufficient documentation

## 2017-11-26 DIAGNOSIS — E669 Obesity, unspecified: Secondary | ICD-10-CM

## 2017-11-26 DIAGNOSIS — Z6841 Body Mass Index (BMI) 40.0 and over, adult: Secondary | ICD-10-CM | POA: Insufficient documentation

## 2017-11-26 DIAGNOSIS — F329 Major depressive disorder, single episode, unspecified: Secondary | ICD-10-CM | POA: Insufficient documentation

## 2017-11-26 NOTE — Progress Notes (Signed)
Pre-Op Assessment Visit:  Pre-Operative Sleeve Gastrectomy Surgery  Medical Nutrition Therapy:  Appt start time: 11:05  End time:  12:05  Patient was seen on 11/26/2017 for Pre-Operative Nutrition Assessment. Assessment and letter of approval faxed to [employer] Bariatric Surgery Program coordinator on 11/26/2017.   Pt stated she was totally fine with quitting smoking but then questioned why she had to quit 3 times throughout the appointment even after the education was provided. Pt states she does not sleep well and snacks instead. Pt asked if she was stupid for wanting bariatric surgery when she has no other medical issues.  Pt states she needs 6 SWL.   Pt expectation of surgery: the need to be more disciplined, to use it as a tool  Pt expectation of Dietitian: to give me good advice and organization   Start weight at [facility]: 234.4 BMI: 40.23: pt was educated on this as far as insurance coverage   24 hr Dietary Recall: First Meal: banana  Snack: oatmeal or salad Second Meal: rice cakes Snack:  Third Meal: meat, starch, vegetable  Snack: peanut butter crackers, cookies, candy Beverages: coffee with cream and sugar, 50.7 oz water, milk  Encouraged to engage in 150 minutes of moderate physical activity including cardiovascular and weight baring weekly  Handouts given during visit include:  . Pre-Op Goals . Bariatric Surgery Protein Shakes During the appointment today the following Pre-Op Goals were reviewed with the patient: -----Document intake . Maintain or lose weight as instructed by your surgeon . Make healthy food choices . Begin to limit portion sizes . Limited concentrated sugars and fried foods . Keep fat/sugar in the single digits per serving on             food labels . Practice CHEWING your food  (aim for 30 chews per bite or until applesauce consistency) . Practice not drinking 15 minutes before, during, and 30 minutes after each meal/snack . Avoid all  carbonated beverages  . Avoid/limit caffeinated beverages  . Avoid all sugar-sweetened beverages . Consume 3 meals per day; eat every 3-5 hours . Make a list of non-food related activities . Aim for 64-100 ounces of FLUID daily  . Aim for at least 60-80 grams of PROTEIN daily . Look for a liquid protein source that contain ?15 g protein and ?5 g carbohydrate  (ex: shakes, drinks, shots)  -Follow diet recommendations listed below   Energy and Macronutrient Recomendations: Calories: 1500 Carbohydrate: 170 Protein: 112 Fat: 42  Demonstrated degree of understanding via:  Teach Back  Teaching Method Utilized:  Visual Auditory Hands on  Barriers to learning/adherence to lifestyle change: none identified   Patient to call the [facility] to enroll in Pre-Op and Post-Op Nutrition Education when surgery date is scheduled.

## 2017-12-27 ENCOUNTER — Ambulatory Visit (HOSPITAL_COMMUNITY): Payer: Self-pay

## 2017-12-27 ENCOUNTER — Other Ambulatory Visit (HOSPITAL_COMMUNITY): Payer: Self-pay

## 2017-12-29 ENCOUNTER — Encounter: Payer: Self-pay | Admitting: Skilled Nursing Facility1

## 2017-12-29 ENCOUNTER — Encounter: Payer: Commercial Managed Care - PPO | Attending: General Surgery | Admitting: Skilled Nursing Facility1

## 2017-12-29 DIAGNOSIS — Z713 Dietary counseling and surveillance: Secondary | ICD-10-CM | POA: Insufficient documentation

## 2017-12-29 DIAGNOSIS — E669 Obesity, unspecified: Secondary | ICD-10-CM

## 2017-12-29 DIAGNOSIS — F329 Major depressive disorder, single episode, unspecified: Secondary | ICD-10-CM | POA: Diagnosis not present

## 2017-12-29 DIAGNOSIS — F17200 Nicotine dependence, unspecified, uncomplicated: Secondary | ICD-10-CM | POA: Insufficient documentation

## 2017-12-29 DIAGNOSIS — Z6841 Body Mass Index (BMI) 40.0 and over, adult: Secondary | ICD-10-CM | POA: Insufficient documentation

## 2017-12-29 NOTE — Progress Notes (Signed)
Sleeve Gastrectomy Assessment:   1st SWL Appointment.  Pt states she needs 6 SWL.  Pt arrives having maintained weight. Pt was supposed to track her food but did not. Pt states she has been working on not going so long without eating and having more small meals as well not eating as much at night.  Pt states she snacks in bed at night and wakes up several times in the night to eat not really realizing she has done it until she finds the empty food wrappers in her bed. When this was discussed pt began to tear up. Pt states she thinks maybe it is from habit where she used to take adderall and eat nothing all day and then stop taking it and then over eat. Pt states she eats to fall asleep.  Pt states she will meet with Dr. Ardath Sax for assessment.   Start weight at [facility]: 234.4 Weight: 234.4 BMI: 40.23:    MEDICATIONS: See List   DIETARY INTAKE:  24-hr recall:  B ( 6:30AM): banana Snk ( 10-11AM): rice cake L ( 1-2PM): [place] Snk ( PM):  D ( PM): vegetable: broccoli, green beans , corn, meat: chicken or haburger, starch: mashed potato or rice  Snk ( PM): milk and popcorn and crackers and spoon full of peanut butter Beverages: coffee, water, coke zero  Usual physical activity: ADL's  Diet to Follow: 1600 calories 180 g carbohydrates 120 g protein 44 g fat   Nutritional Diagnosis:  -3.3 Overweight/obesity related to past poor dietary habits and physical inactivity as evidenced by patient w/ planned sleeve gastrecomy surgery following dietary guidelines for continued weight loss.    Intervention:  Nutrition counseling for upcoming Bariatric Surgery. Goals: -Encouraged to engage in 150 minutes of moderate physical activity including cardiovascular and weight baring weekly -[email or web address] -Eat throughout the day -Identify weather it is hunger or appetite: use flow sheet -Delve deeper into weather it is a feeling or need and fill apropriatly  for night time snacking  Teaching Method Utilized:  Visual Auditory Hands on  Barriers to learning/adherence to lifestyle change: emotional food connection   Demonstrated degree of understanding via:  Teach Back   Monitoring/Evaluation:  Dietary intake, exercise,and body weight prn.

## 2017-12-29 NOTE — Patient Instructions (Signed)
[email or web address]   

## 2018-01-24 ENCOUNTER — Ambulatory Visit (HOSPITAL_COMMUNITY): Admission: RE | Admit: 2018-01-24 | Payer: Self-pay | Source: Ambulatory Visit

## 2018-01-24 ENCOUNTER — Ambulatory Visit (HOSPITAL_COMMUNITY): Payer: Self-pay

## 2018-02-02 ENCOUNTER — Encounter: Payer: Commercial Managed Care - PPO | Attending: General Surgery | Admitting: Registered"

## 2018-02-02 ENCOUNTER — Encounter: Payer: Self-pay | Admitting: Registered"

## 2018-02-02 DIAGNOSIS — F329 Major depressive disorder, single episode, unspecified: Secondary | ICD-10-CM | POA: Diagnosis not present

## 2018-02-02 DIAGNOSIS — Z6841 Body Mass Index (BMI) 40.0 and over, adult: Secondary | ICD-10-CM | POA: Diagnosis not present

## 2018-02-02 DIAGNOSIS — F17200 Nicotine dependence, unspecified, uncomplicated: Secondary | ICD-10-CM | POA: Insufficient documentation

## 2018-02-02 DIAGNOSIS — Z713 Dietary counseling and surveillance: Secondary | ICD-10-CM | POA: Diagnosis not present

## 2018-02-02 DIAGNOSIS — E669 Obesity, unspecified: Secondary | ICD-10-CM

## 2018-02-02 NOTE — Patient Instructions (Addendum)
-   Aim for physical activity at least 2 times/week, 30 min each using workout videos.   - Aim for more well-balanced meals the day.   - Continue to use hunger vs appetite flow sheet along with 101 things to do besides to eat.  - Find a protein shake that you enjoy with at least 15 grams or more of protein and 5 grams or less of carbohydrates.

## 2018-02-02 NOTE — Progress Notes (Signed)
Sleeve Gastrectomy Assessment:  2nd SWL Appointment.  Pt states she needs 6 SWL.  Pt arrives having maintained weight. Pt states has been unable to reach out to mental health providers since previous visit due her family being sick. Pt states she has been preoccupied with being a wife and mom as her family has been recovering. Pt states she has 3 children (ages 70, 43, and 1). Pt states she has been making healthier choices with snacking and eating since previous visit. Pt states she uses child size plate during dinner.   Pt was supposed to track her food but did not. Pt states she has been working on not going so long without eating and having more small meals as well not eating as much at night. Pt states she snacks in bed at night and wakes up several times in the night to eat not really realizing she has done it until she finds the empty food wrappers in her bed. When this was discussed pt began to tear up. Pt states she thinks maybe it is from habit where she used to take adderall and eat nothing all day and then stop taking it and then over eat. Pt states she eats to fall asleep.  Pt states she will meet with Dr. Ardath Sax for assessment.   Start weight at [facility]: 234.4 Weight: 234.9 BMI: 40.32   MEDICATIONS: See List   DIETARY INTAKE:  24-hr recall:  B ( 6:30AM): banana Snk ( 10-11AM): rice cake L ( 1-2PM): [place] salad Snk ( PM): fruit D ( PM): vegetable: broccoli, green beans, corn, meat: chicken or haburger, starch: mashed potato or rice  Snk ( PM): milk and popcorn and crackers and spoon full of peanut butter Beverages: coffee, water, coke zero  Usual physical activity: ADL's  Diet to Follow: 1600 calories 180 g carbohydrates 120 g protein 44 g fat   Nutritional Diagnosis:  [place]-3.3 Overweight/obesity related to past poor dietary habits and physical inactivity as evidenced by patient w/ planned sleeve gastrecomy surgery following dietary guidelines for continued weight loss.     Intervention:  Nutrition counseling for upcoming Bariatric Surgery. Goals: - Aim for physical activity at least 2 times/week, 30 min each using workout videos.  - Aim for more well-balanced meals the day.  - Continue to use hunger vs appetite flow sheet along with 101 things to do besides to eat. - Find a protein shake that you enjoy with at least 15 grams or more of protein and 5 grams or less of carbohydrates.   Teaching Method Utilized:  Visual Auditory Hands on  Barriers to learning/adherence to lifestyle change: emotional food connection   Demonstrated degree of understanding via:  Teach Back   Monitoring/Evaluation:  Dietary intake, exercise,and body weight in 1 month(s).

## 2018-03-02 ENCOUNTER — Encounter: Payer: Commercial Managed Care - PPO | Attending: General Surgery | Admitting: Skilled Nursing Facility1

## 2018-03-02 ENCOUNTER — Encounter: Payer: Self-pay | Admitting: Skilled Nursing Facility1

## 2018-03-02 DIAGNOSIS — F17200 Nicotine dependence, unspecified, uncomplicated: Secondary | ICD-10-CM | POA: Diagnosis not present

## 2018-03-02 DIAGNOSIS — Z713 Dietary counseling and surveillance: Secondary | ICD-10-CM | POA: Diagnosis present

## 2018-03-02 DIAGNOSIS — Z6841 Body Mass Index (BMI) 40.0 and over, adult: Secondary | ICD-10-CM | POA: Insufficient documentation

## 2018-03-02 DIAGNOSIS — F329 Major depressive disorder, single episode, unspecified: Secondary | ICD-10-CM | POA: Diagnosis not present

## 2018-03-02 DIAGNOSIS — E669 Obesity, unspecified: Secondary | ICD-10-CM

## 2018-03-02 NOTE — Progress Notes (Signed)
Sleeve Gastrectomy Assessment:  3rd SWL Appointment.  Pt states she needs 6 SWL.   Pt arrives having lost about 3 pounds. Pt reports having just gotten over the flu. Pt states she is Trying to eat more often but this is a struggle due to work. Walking more with the dogs and kids. Used the flow sheet but lost it. Today just a banana to eat so far. Drinking water and using the bathroom trying to not to eat in the middle of the night. Thinking about not working anymore and  will decide in the next few months.  Start weight at [facility]: 234.4 Weight: 232 BMI: 39.82 (pt is aware)   MEDICATIONS: See List   DIETARY INTAKE:  24-hr recall: 4-5 days a week  B ( 6:30AM): banana and coffee sometimes toast  Snk ( 10-11AM): rice cake L ( 1-2PM): [place] salad Snk ( PM): fruit or pack of crackers D ( PM): vegetable: broccoli, green beans, corn, meat: chicken or haburger, starch: mashed potato or rice  Snk ( PM): milk and popcorn and crackers and spoon full of peanut butter Beverages: coffee, water, coke zero  Usual physical activity: walking the dogs and kiids   Diet to Follow: 1600 calories 180 g carbohydrates 120 g protein 44 g fat   Nutritional Diagnosis:  [place]-3.3 Overweight/obesity related to past poor dietary habits and physical inactivity as evidenced by patient w/ planned sleeve gastrecomy surgery following dietary guidelines for continued weight loss.    Intervention:  Nutrition counseling for upcoming Bariatric Surgery. Goals: given mental health website again - Aim for physical activity at least 2 times/week, 30 min each using workout videos.  - Aim for more well-balanced meals the day.  - Continue to use hunger vs appetite flow sheet along with 101 things to do besides to eat. - Find a protein shake that you enjoy with at least 15 grams or more of protein and 5 grams or less of carbohydrates.    Teaching Method Utilized:  Visual Auditory Hands on  Barriers to learning/adherence  to lifestyle change: emotional food connection   Demonstrated degree of understanding via:  Teach Back   Monitoring/Evaluation:  Dietary intake, exercise,and body weight in 1 month(s).

## 2018-03-29 ENCOUNTER — Encounter: Payer: Self-pay | Admitting: Skilled Nursing Facility1

## 2018-03-29 ENCOUNTER — Encounter: Payer: Commercial Managed Care - PPO | Attending: General Surgery | Admitting: Skilled Nursing Facility1

## 2018-03-29 DIAGNOSIS — Z713 Dietary counseling and surveillance: Secondary | ICD-10-CM | POA: Insufficient documentation

## 2018-03-29 DIAGNOSIS — F329 Major depressive disorder, single episode, unspecified: Secondary | ICD-10-CM | POA: Diagnosis not present

## 2018-03-29 DIAGNOSIS — Z6841 Body Mass Index (BMI) 40.0 and over, adult: Secondary | ICD-10-CM | POA: Insufficient documentation

## 2018-03-29 DIAGNOSIS — F17200 Nicotine dependence, unspecified, uncomplicated: Secondary | ICD-10-CM | POA: Diagnosis not present

## 2018-03-29 DIAGNOSIS — E669 Obesity, unspecified: Secondary | ICD-10-CM

## 2018-03-29 NOTE — Progress Notes (Signed)
Sleeve Gastrectomy Assessment: 4th SWL Appointment.  Pt states she needs 6 SWL.   Pt arrives having maintained weight. Pt states she has quit her job and is enjoying her baby. Pt states he walks for 45 minutes and plays with the dogs. Pt states he has tried some Licensed convey[employer]. Pt states she has stopped drinking soda.   Start weight at [facility]: 234.4 Weight: 232.14 BMI: 39.98 (pt is aware)  MEDICATIONS: See List   DIETARY INTAKE:  24-hr recall: 4-5 days a week  B ( 10AM): banana and coffee sometimes toast  Snk ( 10-11AM): 3-4 crackers  L ( 12:30PM): cheese quesadilla or peanut butter jelly or salad or crackers and pepperoni  Snk ( PM):  D ( PM): goolash zuchini noodles and sauce Snk ( PM): milk and popcorn and crackers and spoon full of peanut butter Beverages: coffee, water  Usual physical activity: walking the dogs and kiids   Diet to Follow: 1600 calories 180 g carbohydrates 120 g protein 44 g fat   Nutritional Diagnosis:  [place]-3.3 Overweight/obesity related to past poor dietary habits and physical inactivity as evidenced by patient w/ planned sleeve gastrecomy surgery following dietary guidelines for continued weight loss.    Intervention:  Nutrition counseling for upcoming Bariatric Surgery. Goals: given mental health website again [employer] Be aware of when you are doing drive by snacking  Try to add vegetables to lunch  Try not to go longer than 5 hours without eating Keep working on drinking while eating  Keep working on increasing your water intake, aim for 64 ounces   Teaching Method Utilized:  Visual Auditory Hands on  Barriers to learning/adherence to lifestyle change: emotional food connection   Demonstrated degree of understanding via:  Teach Back   Monitoring/Evaluation:  Dietary intake, exercise,and body weight in 1 month(s).

## 2018-03-31 ENCOUNTER — Ambulatory Visit: Payer: Commercial Managed Care - PPO | Admitting: Skilled Nursing Facility1

## 2018-04-28 ENCOUNTER — Ambulatory Visit (HOSPITAL_COMMUNITY)
Admission: RE | Admit: 2018-04-28 | Discharge: 2018-04-28 | Disposition: A | Discharge status: Home / Self Care | Payer: Commercial Managed Care - PPO | Source: Ambulatory Visit | Attending: General Surgery | Admitting: General Surgery

## 2018-04-28 DIAGNOSIS — Z01810 Encounter for preprocedural cardiovascular examination: Secondary | ICD-10-CM | POA: Insufficient documentation

## 2018-05-02 ENCOUNTER — Encounter: Payer: Self-pay | Admitting: Skilled Nursing Facility1

## 2018-05-02 ENCOUNTER — Encounter: Payer: Commercial Managed Care - PPO | Attending: General Surgery | Admitting: Skilled Nursing Facility1

## 2018-05-02 DIAGNOSIS — Z6841 Body Mass Index (BMI) 40.0 and over, adult: Secondary | ICD-10-CM | POA: Insufficient documentation

## 2018-05-02 DIAGNOSIS — E669 Obesity, unspecified: Secondary | ICD-10-CM

## 2018-05-02 DIAGNOSIS — F329 Major depressive disorder, single episode, unspecified: Secondary | ICD-10-CM | POA: Insufficient documentation

## 2018-05-02 DIAGNOSIS — F17200 Nicotine dependence, unspecified, uncomplicated: Secondary | ICD-10-CM | POA: Diagnosis not present

## 2018-05-02 DIAGNOSIS — Z713 Dietary counseling and surveillance: Secondary | ICD-10-CM | POA: Insufficient documentation

## 2018-05-02 NOTE — Progress Notes (Signed)
Sleeve Gastrectomy Assessment: 5th SWL Appointment.  Pt states she needs 6 SWL.   Pt arrives having gained about 3 pounds Pt states she got back from [place] having walking an averaging 23000 steps. Pt staets she has been paying attention to more snacking throughout the day. Pt had specific questions about the surgery: Dietitian answered these questions.  Start weight at [facility]: 234.4 Weight: 235.2 BMI:  40.35  MEDICATIONS: See List   DIETARY INTAKE:  24-hr recall: 4-5 days a week  B ( 10AM): banana and coffee sometimes toast  Snk ( 10-11AM): 3-4 crackers  L ( 12:30PM): cheese quesadilla or peanut butter jelly or salad or crackers and pepperoni or salad or egg sandwich Snk ( PM):  D ( PM): goolash zuchini noodles and sauce Snk ( PM): milk and popcorn and crackers and spoon full of peanut butter Beverages: coffee, water  Usual physical activity: walking the dogs and kiids and some pool stuff  Diet to Follow: 1600 calories 180 g carbohydrates 120 g protein 44 g fat   Nutritional Diagnosis:  [place]-3.3 Overweight/obesity related to past poor dietary habits and physical inactivity as evidenced by patient w/ planned sleeve gastrecomy surgery following dietary guidelines for continued weight loss.    Intervention:  Nutrition counseling for upcoming Bariatric Surgery. Goals: given mental health website again Be aware of when you are doing drive by snacking  Try to add vegetables to lunch  Try not to go longer than 5 hours without eating Keep working on drinking while eating  Keep working on increasing your water intake, aim for 64 ounces   Teaching Method Utilized:  Visual Auditory Hands on  Barriers to learning/adherence to lifestyle change: emotional food connection   Demonstrated degree of understanding via:  Teach Back   Monitoring/Evaluation:  Dietary intake, exercise,and body weight in 1 month(s).

## 2018-05-03 ENCOUNTER — Ambulatory Visit (INDEPENDENT_AMBULATORY_CARE_PROVIDER_SITE_OTHER): Payer: Commercial Managed Care - PPO | Admitting: Psychiatry

## 2018-05-03 DIAGNOSIS — F509 Eating disorder, unspecified: Secondary | ICD-10-CM

## 2018-05-04 ENCOUNTER — Ambulatory Visit: Payer: Commercial Managed Care - PPO | Admitting: Skilled Nursing Facility1

## 2018-05-17 ENCOUNTER — Ambulatory Visit: Payer: Commercial Managed Care - PPO | Admitting: Psychiatry

## 2018-06-01 ENCOUNTER — Ambulatory Visit (INDEPENDENT_AMBULATORY_CARE_PROVIDER_SITE_OTHER): Payer: Commercial Managed Care - PPO | Admitting: Psychiatry

## 2018-06-01 ENCOUNTER — Encounter: Payer: Commercial Managed Care - PPO | Attending: General Surgery | Admitting: Skilled Nursing Facility1

## 2018-06-01 ENCOUNTER — Ambulatory Visit: Payer: Commercial Managed Care - PPO | Admitting: Skilled Nursing Facility1

## 2018-06-01 ENCOUNTER — Encounter: Payer: Self-pay | Admitting: Skilled Nursing Facility1

## 2018-06-01 DIAGNOSIS — Z6841 Body Mass Index (BMI) 40.0 and over, adult: Secondary | ICD-10-CM | POA: Diagnosis not present

## 2018-06-01 DIAGNOSIS — F329 Major depressive disorder, single episode, unspecified: Secondary | ICD-10-CM | POA: Diagnosis not present

## 2018-06-01 DIAGNOSIS — F17200 Nicotine dependence, unspecified, uncomplicated: Secondary | ICD-10-CM | POA: Diagnosis not present

## 2018-06-01 DIAGNOSIS — E669 Obesity, unspecified: Secondary | ICD-10-CM

## 2018-06-01 DIAGNOSIS — F509 Eating disorder, unspecified: Secondary | ICD-10-CM | POA: Diagnosis not present

## 2018-06-01 DIAGNOSIS — Z713 Dietary counseling and surveillance: Secondary | ICD-10-CM | POA: Insufficient documentation

## 2018-06-01 NOTE — Progress Notes (Signed)
Sleeve Gastrectomy Assessment: 6th SWL Appointment.  Pt states she needs 6 SWL.  Pt had specific questions about the surgery: Dietitian answered these questions. Pt arrives having lost about 1 pound. Pt states she worries for having specific foods at the house due to what her family wants. Pt states she wants her kids and husband to eat better. Pt states she is still struggling with night time eating (waking up and eating to go back to sleep). Pt states her therapist referred her to a sleep specialist for night time eating. Pt states she has not been snacking in bed. Pt questioned how to explain to her children what the surgery is and why she is eating ina  Certain way. Dietitian offered guidance on this matter with the focus being on health rather than weight. Pt states her husband and one of her children is exteremly thin and struggle gaining weight.   Start weight at [facility]: 234.4 Weight: 234.7 BMI:  40.29  MEDICATIONS: See List   DIETARY INTAKE:  24-hr recall: 4-5 days a week  B ( 10AM): banana and coffee sometimes toast  Snk ( 10-11AM): 3-4 crackers  L ( 12:30PM): cheese quesadilla or peanut butter jelly or salad or crackers and pepperoni or salad or egg sandwich Snk ( PM):  D ( PM): goolash zuchini noodles and sauce Snk ( PM): milk and popcorn and crackers and spoon full of peanut butter Beverages: coffee, water  Usual physical activity: walking the dogs and kiids and some pool stuff  Diet to Follow: 1600 calories 180 g carbohydrates 120 g protein 44 g fat   Nutritional Diagnosis:  [place]-3.3 Overweight/obesity related to past poor dietary habits and physical inactivity as evidenced by patient w/ planned sleeve gastrecomy surgery following dietary guidelines for continued weight loss.    Intervention:  Nutrition counseling for upcoming Bariatric Surgery. Goals: given mental health website again Be aware of when you are doing drive by snacking  Try to add vegetables to lunch   Try not to go longer than 5 hours without eating Keep working on drinking while eating  Keep working on increasing your water intake, aim for 64 ounces   Teaching Method Utilized:  Visual Auditory Hands on  Barriers to learning/adherence to lifestyle change: emotional food connection   Demonstrated degree of understanding via:  Teach Back   Monitoring/Evaluation:  Dietary intake, exercise,and body weight in 1 month(s).

## 2018-06-20 ENCOUNTER — Encounter: Payer: Commercial Managed Care - PPO | Attending: General Surgery | Admitting: Skilled Nursing Facility1

## 2018-06-20 DIAGNOSIS — E669 Obesity, unspecified: Secondary | ICD-10-CM

## 2018-06-20 DIAGNOSIS — Z713 Dietary counseling and surveillance: Secondary | ICD-10-CM | POA: Insufficient documentation

## 2018-06-20 DIAGNOSIS — F329 Major depressive disorder, single episode, unspecified: Secondary | ICD-10-CM | POA: Diagnosis not present

## 2018-06-20 DIAGNOSIS — F17200 Nicotine dependence, unspecified, uncomplicated: Secondary | ICD-10-CM | POA: Insufficient documentation

## 2018-06-20 DIAGNOSIS — Z6841 Body Mass Index (BMI) 40.0 and over, adult: Secondary | ICD-10-CM | POA: Insufficient documentation

## 2018-06-21 ENCOUNTER — Encounter: Payer: Self-pay | Admitting: Skilled Nursing Facility1

## 2018-06-21 NOTE — Progress Notes (Signed)
Pre-Operative Nutrition Class:  Appt start time: 3875   End time:  1830.  Patient was seen on 06/20/2018 for Pre-Operative Bariatric Surgery Education [facility].   Surgery date:  Surgery type: sleeve Start weight at [facility]: 234.4 Weight today: 237.8  Samples given per MNT protocol. Patient educated on appropriate usage: Bariatric Advantage Multivitamin Lot # [id number] Exp: 10/20  Bariatric Advantage Calcium  Lot # [id number] Exp:08/06/2019  [place]jury Protein Shake Lot # [id number] Exp: oct/16/19  The following the learning objectives were met by the patient during this course:  Identify Pre-Op Dietary Goals and will begin 2 weeks pre-operatively  Identify appropriate sources of fluids and proteins   State protein recommendations and appropriate sources pre and post-operatively  Identify Post-Operative Dietary Goals and will follow for 2 weeks post-operatively  Identify appropriate multivitamin and calcium sources  Describe the need for physical activity post-operatively and will follow MD recommendations  State when to call healthcare provider regarding medication questions or post-operative complications  Handouts given during class include:  Pre-Op Bariatric Surgery Diet Handout  Protein Shake Handout  Post-Op Bariatric Surgery Nutrition Handout  BELT Program Information Flyer  Support Group Information Flyer  [facility] Bariatric Supplements Price List  [facility]: [facility] diet advancement per MD.

## 2018-06-29 ENCOUNTER — Ambulatory Visit: Payer: Self-pay | Admitting: General Surgery

## 2018-07-04 ENCOUNTER — Ambulatory Visit: Payer: Self-pay

## 2018-07-15 NOTE — Patient Instructions (Addendum)
Jenny Anderson  07/15/2018   Your procedure is scheduled on: 07-25-18    Report to [facility] Main  Entrance    Report to admitting at 5:30AM    Call this number if you have problems the morning of surgery [phone]     Remember: NO SOLID FOOD AFTER 6:00 PM THE NIGHT BEFORE YOUR SURGERY. YOU MAY DRINK CLEAR FLUIDS. DRINK 2 PRESURGERY ENSURE DRINKS THE NIGHT BEFORE SURGERY AT 10:00 PM. MORNING OF SURGERY DRINK:  1SHAKE BEFORE YOU LEAVE HOME, DRINK ALL OF THE SHAKE AT ONE TIME. THE SHAKE YOU DRINK BEFORE YOU LEAVE HOME WILL BE THE LAST FLUIDS YOU DRINK BEFORE SURGERY.     CLEAR LIQUID DIET   Foods Allowed                                                                     Foods Excluded  Coffee and tea, regular and decaf                             liquids that you cannot  Plain Jell-O in any flavor                                             see through such as: Fruit ices (not with fruit pulp)                                     milk, soups, orange juice  Iced Popsicles                                    All solid food Carbonated beverages, regular and diet                                    Cranberry, grape and apple juices Sports drinks like Gatorade Lightly seasoned clear broth or consume(fat free) Sugar, honey syrup  Sample Menu Breakfast                                Lunch                                     Supper Cranberry juice                    Beef broth                            Chicken broth Jell-O  Grape juice                           Apple juice Coffee or tea                        Jell-O                                      Popsicle                                                Coffee or tea                        Coffee or tea  _____________________________________________________________________  PAIN IS EXPECTED AFTER SURGERY AND WILL NOT BE COMPLETELY ELIMINATED. AMBULATION AND TYLENOL  WILL HELP REDUCE INCISIONAL AND GAS PAIN. MOVEMENT IS KEY!  YOU ARE EXPECTED TO BE OUT OF BED WITHIN 4 HOURS OF ADMISSION TO YOUR PATIENT ROOM.  SITTING IN THE RECLINER THROUGHOUT THE DAY IS IMPORTANT FOR DRINKING FLUIDS AND MOVING GAS THROUGHOUT THE GI TRACT.  COMPRESSION STOCKINGS SHOULD BE WORN [place] UNLESS YOU ARE WALKING.   INCENTIVE SPIROMETER SHOULD BE USED EVERY HOUR WHILE AWAKE TO DECREASE POST-OPERATIVE COMPLICATIONS SUCH AS PNEUMONIA.  WHEN DISCHARGED HOME, IT IS IMPORTANT TO CONTINUE TO WALK EVERY HOUR AND USE THE INCENTIVE SPIROMETER EVERY HOUR.      Take these medicines the morning of surgery with A SIP OF WATER: NONE                                 You may not have any metal on your body including hair pins and              piercings  Do not wear jewelry, make-up, lotions, powders or perfumes, deodorant             Do not wear nail polish.  Do not shave  48 hours prior to surgery.     Do not bring valuables to the hospital. [place].  Contacts, dentures or bridgework may not be worn into surgery.  Leave suitcase in the car. After surgery it may be brought to your room.                  Please read over the following fact sheets you were given: _____________________________________________________________________             [facility] - Preparing for Surgery Before surgery, you can play an important role.  Because skin is not sterile, your skin needs to be as free of germs as possible.  You can reduce the number of germs on your skin by washing with CHG (chlorahexidine gluconate) soap before surgery.  CHG is an antiseptic cleaner which kills germs and bonds with the skin to continue killing germs even after washing. Please DO NOT use if you have an allergy to CHG or antibacterial soaps.  If your skin becomes reddened/irritated stop using the CHG and inform  your nurse when you arrive at [facility]  Stay. Do not shave (including legs and underarms) for at least 48 hours prior to the first CHG shower.  You may shave your face/neck. Please follow these instructions carefully:  1.  Shower with CHG Soap the night before surgery and the  morning of Surgery.  2.  If you choose to wash your hair, wash your hair first as usual with your  normal  shampoo.  3.  After you shampoo, rinse your hair and body thoroughly to remove the  shampoo.                           4.  Use CHG as you would any other liquid soap.  You can apply chg directly  to the skin and wash                       Gently with a scrungie or clean washcloth.  5.  Apply the CHG Soap to your body ONLY FROM THE NECK DOWN.   Do not use on face/ open                           Wound or open sores. Avoid contact with eyes, ears mouth and genitals (private parts).                       Wash face,  Genitals (private parts) with your normal soap.             6.  Wash thoroughly, paying special attention to the area where your surgery  will be performed.  7.  Thoroughly rinse your body with warm water from the neck down.  8.  DO NOT shower/wash with your normal soap after using and rinsing off  the CHG Soap.                9.  Pat yourself dry with a clean towel.            10.  Wear clean pajamas.            11.  Place clean sheets on your bed the night of your first shower and do not  sleep with pets. Day of Surgery : Do not apply any lotions/deodorants the morning of surgery.  Please wear clean clothes to the hospital/surgery center.  FAILURE TO FOLLOW THESE INSTRUCTIONS MAY RESULT IN THE CANCELLATION OF YOUR SURGERY PATIENT SIGNATURE_________________________________  NURSE SIGNATURE__________________________________  ________________________________________________________________________   Adam Phenix  An incentive spirometer is a tool that can help keep your lungs clear and active. This tool measures how well you are  filling your lungs with each breath. Taking long deep breaths may help reverse or decrease the chance of developing breathing (pulmonary) problems (especially infection) following:  A long period of time when you are unable to move or be active. BEFORE THE PROCEDURE   If the spirometer includes an indicator to show your best effort, your nurse or respiratory therapist will set it to a desired goal.  If possible, sit up straight or lean slightly forward. Try not to slouch.  Hold the incentive spirometer in an upright position. INSTRUCTIONS FOR USE  1. Sit on the edge of your bed if possible, or sit up as far as you can in bed or on a chair. 2. Hold the incentive spirometer in an upright position.  3. Breathe out normally. 4. Place the mouthpiece in your mouth and seal your lips tightly around it. 5. Breathe in slowly and as deeply as possible, raising the piston or the ball toward the top of the column. 6. Hold your breath for 3-5 seconds or for as long as possible. Allow the piston or ball to fall to the bottom of the column. 7. Remove the mouthpiece from your mouth and breathe out normally. 8. Rest for a few seconds and repeat Steps 1 through 7 at least 10 times every 1-2 hours when you are awake. Take your time and take a few normal breaths between deep breaths. 9. The spirometer may include an indicator to show your best effort. Use the indicator as a goal to work toward during each repetition. 10. After each set of 10 deep breaths, practice coughing to be sure your lungs are clear. If you have an incision (the cut made at the time of surgery), support your incision when coughing by placing a pillow or rolled up towels firmly against it. Once you are able to get out of bed, walk around indoors and cough well. You may stop using the incentive spirometer when instructed by your caregiver.  RISKS AND COMPLICATIONS  Take your time so you do not get dizzy or light-headed.  If you are in pain,  you may need to take or ask for pain medication before doing incentive spirometry. It is harder to take a deep breath if you are having pain. AFTER USE  Rest and breathe slowly and easily.  It can be helpful to keep track of a log of your progress. Your caregiver can provide you with a simple table to help with this. If you are using the spirometer at home, follow these instructions: [place] IF:   You are having difficultly using the spirometer.  You have trouble using the spirometer as often as instructed.  Your pain medication is not giving enough relief while using the spirometer.  You develop fever of 100.5 F (38.1 C) or higher. SEEK IMMEDIATE MEDICAL CARE IF:   You cough up bloody sputum that had not been present before.  You develop fever of 102 F (38.9 C) or greater.  You develop worsening pain at or near the incision site. MAKE SURE YOU:   Understand these instructions.  Will watch your condition.  Will get help right away if you are not doing well or get worse. Document Released: 04/12/2007 Document Revised: 02/22/2012 Document Reviewed: 06/13/2007 ExitCare Patient Information 2014 ExitCare, [place].   ________________________________________________________________________  WHAT IS A BLOOD TRANSFUSION? Blood Transfusion Information  A transfusion is the replacement of blood or some of its parts. Blood is made up of multiple cells which provide different functions.  Red blood cells carry oxygen and are used for blood loss replacement.  White blood cells fight against infection.  Platelets control bleeding.  Plasma helps clot blood.  Other blood products are available for specialized needs, such as hemophilia or other clotting disorders. BEFORE THE TRANSFUSION  Who gives blood for transfusions?   Healthy volunteers who are fully evaluated to make sure their blood is safe. This is blood bank blood. Transfusion therapy is the safest it has ever  been in the practice of medicine. Before blood is taken from a donor, a complete history is taken to make sure that person has no history of diseases nor engages in risky social behavior (examples are intravenous drug use or sexual activity with multiple partners).  The donor's travel history is screened to minimize risk of transmitting infections, such as malaria. The donated blood is tested for signs of infectious diseases, such as HIV and hepatitis. The blood is then tested to be sure it is compatible with you in order to minimize the chance of a transfusion reaction. If you or a relative donates blood, this is often done in anticipation of surgery and is not appropriate for emergency situations. It takes many days to process the donated blood. RISKS AND COMPLICATIONS Although transfusion therapy is very safe and saves many lives, the main dangers of transfusion include:   Getting an infectious disease.  Developing a transfusion reaction. This is an allergic reaction to something in the blood you were given. Every precaution is taken to prevent this. The decision to have a blood transfusion has been considered carefully by your caregiver before blood is given. Blood is not given unless the benefits outweigh the risks. AFTER THE TRANSFUSION  Right after receiving a blood transfusion, you will usually feel much better and more energetic. This is especially true if your red blood cells have gotten low (anemic). The transfusion raises the level of the red blood cells which carry oxygen, and this usually causes an energy increase.  The nurse administering the transfusion will monitor you carefully for complications. HOME CARE INSTRUCTIONS  No special instructions are needed after a transfusion. You may find your energy is better. Speak with your caregiver about any limitations on activity for underlying diseases you may have. SEEK MEDICAL CARE IF:   Your condition is not improving after your  transfusion.  You develop redness or irritation at the intravenous (IV) site. SEEK IMMEDIATE MEDICAL CARE IF:  Any of the following symptoms occur over the next 12 hours:  Shaking chills.  You have a temperature by mouth above 102 F (38.9 C), not controlled by medicine.  Chest, back, or muscle pain.  People around you feel you are not acting correctly or are confused.  Shortness of breath or difficulty breathing.  Dizziness and fainting.  You get a rash or develop hives.  You have a decrease in urine output.  Your urine turns a dark color or changes to pink, red, or brown. Any of the following symptoms occur over the next 10 days:  You have a temperature by mouth above 102 F (38.9 C), not controlled by medicine.  Shortness of breath.  Weakness after normal activity.  The white part of the eye turns yellow (jaundice).  You have a decrease in the amount of urine or are urinating less often.  Your urine turns a dark color or changes to pink, red, or brown. Document Released: 11/27/2000 Document Revised: 02/22/2012 Document Reviewed: 07/16/2008 [facility] Patient Information 2014 [place], [place].  _______________________________________________________________________

## 2018-07-15 NOTE — Progress Notes (Signed)
EKG 04-28-18 Epic   CXR 04-28-18 Epic

## 2018-07-18 ENCOUNTER — Encounter (HOSPITAL_COMMUNITY)
Admission: RE | Admit: 2018-07-18 | Discharge: 2018-07-18 | Disposition: A | Discharge status: Home / Self Care | Payer: Commercial Managed Care - PPO | Source: Ambulatory Visit | Attending: General Surgery | Admitting: General Surgery

## 2018-07-18 ENCOUNTER — Other Ambulatory Visit: Payer: Self-pay

## 2018-07-18 ENCOUNTER — Encounter (HOSPITAL_COMMUNITY): Payer: Self-pay | Admitting: Emergency Medicine

## 2018-07-18 DIAGNOSIS — Z0183 Encounter for blood typing: Secondary | ICD-10-CM | POA: Diagnosis not present

## 2018-07-18 DIAGNOSIS — Z01812 Encounter for preprocedural laboratory examination: Secondary | ICD-10-CM | POA: Diagnosis present

## 2018-07-18 DIAGNOSIS — E669 Obesity, unspecified: Secondary | ICD-10-CM | POA: Diagnosis not present

## 2018-07-18 DIAGNOSIS — Z6841 Body Mass Index (BMI) 40.0 and over, adult: Secondary | ICD-10-CM | POA: Diagnosis not present

## 2018-07-18 HISTORY — DX: Unspecified hearing loss, right ear: H91.91

## 2018-07-18 HISTORY — DX: Palpitations: R00.2

## 2018-07-18 LAB — COMPREHENSIVE METABOLIC PANEL
ALT: 14 U/L (ref 0–44)
AST: 21 U/L (ref 15–41)
Albumin: 4 g/dL (ref 3.5–5.0)
Alkaline Phosphatase: 71 U/L (ref 38–126)
Anion gap: 5 (ref 5–15)
BUN: 11 mg/dL (ref 6–20)
CHLORIDE: 105 mmol/L (ref 98–111)
CO2: 28 mmol/L (ref 22–32)
Calcium: 9.1 mg/dL (ref 8.9–10.3)
Creatinine, Ser: 0.7 mg/dL (ref 0.44–1.00)
GFR calc Af Amer: >60 mL/min (ref >60)
Glucose, Bld: 92 mg/dL (ref 70–99)
POTASSIUM: 4.4 mmol/L (ref 3.5–5.1)
SODIUM: 138 mmol/L (ref 135–145)
Total Bilirubin: 0.3 mg/dL (ref 0.3–1.2)
Total Protein: 6.9 g/dL (ref 6.5–8.1)

## 2018-07-18 LAB — CBC WITH DIFFERENTIAL/PLATELET
BASOS ABS: 0.1 10*3/uL (ref 0.0–0.1)
BASOS PCT: 1 %
EOS ABS: 0.3 10*3/uL (ref 0.0–0.7)
EOS PCT: 4 %
HCT: 38.7 % (ref 36.0–46.0)
Hemoglobin: 13.6 g/dL (ref 12.0–15.0)
Lymphocytes Relative: 26 %
Lymphs Abs: 2 10*3/uL (ref 0.7–4.0)
MCH: 31.1 pg (ref 26.0–34.0)
MCHC: 35.1 g/dL (ref 30.0–36.0)
MCV: 88.4 fL (ref 78.0–100.0)
MONO ABS: 0.4 10*3/uL (ref 0.1–1.0)
Monocytes Relative: 5 %
Neutro Abs: 4.9 10*3/uL (ref 1.7–7.7)
Neutrophils Relative %: 64 %
PLATELETS: 381 10*3/uL (ref 150–400)
RBC: 4.38 MIL/uL (ref 3.87–5.11)
RDW: 12.5 % (ref 11.5–15.5)
WBC: 7.5 10*3/uL (ref 4.0–10.5)

## 2018-07-19 LAB — ABO/RH: ABO/RH(D): O POS

## 2018-07-24 MED ORDER — BUPIVACAINE LIPOSOME 1.3 % IJ SUSP
20.0000 mL | Freq: Once | INTRAMUSCULAR | Status: DC
  Filled 2018-07-24: qty 20

## 2018-07-24 NOTE — Anesthesia Preprocedure Evaluation (Addendum)
Anesthesia Evaluation  Patient identified by MRN, date of birth, ID band Patient awake    Reviewed: Allergy & Precautions, NPO status , Patient's Chart, lab work & pertinent test results  History of Anesthesia Complications Negative for: history of anesthetic complications  Airway Mallampati: II  TM Distance: >3 FB Neck ROM: Full    Dental  (+) Dental Advisory Given, Teeth Intact   Pulmonary former smoker,    breath sounds clear to auscultation       Cardiovascular negative cardio ROS   Rhythm:Regular Rate:Normal     Neuro/Psych  Headaches, Anxiety Depression  Hearing deficit right ear     GI/Hepatic negative GI ROS, Neg liver ROS,   Endo/Other  Morbid obesity  Renal/GU negative Renal ROS  negative genitourinary   Musculoskeletal negative musculoskeletal ROS (+)   Abdominal (+) + obese,   Peds  Hematology negative hematology ROS (+)   Anesthesia Other Findings HPV  Reproductive/Obstetrics                            Anesthesia Physical Anesthesia Plan  ASA: III  Anesthesia Plan: General   Post-op Pain Management:    Induction: Intravenous  PONV Risk Score and Plan: 4 or greater and Treatment may vary due to age or medical condition, Ondansetron, Dexamethasone, Midazolam and Scopolamine patch - Pre-op  Airway Management Planned: Oral ETT  Additional Equipment: None  Intra-op Plan:   Post-operative Plan: Extubation in OR  Informed Consent: I have reviewed the patients History and Physical, chart, labs and discussed the procedure including the risks, benefits and alternatives for the proposed anesthesia with the patient or authorized representative who has indicated his/her understanding and acceptance.   Dental advisory given  Plan Discussed with: CRNA and Anesthesiologist  Anesthesia Plan Comments: (ERAS protocol)       Anesthesia Quick Evaluation

## 2018-07-25 ENCOUNTER — Inpatient Hospital Stay (HOSPITAL_COMMUNITY): Payer: Commercial Managed Care - PPO | Admitting: Anesthesiology

## 2018-07-25 ENCOUNTER — Encounter (HOSPITAL_COMMUNITY)
Admission: RE | Disposition: A | Discharge status: Home / Self Care | Payer: Self-pay | Source: Ambulatory Visit | Attending: General Surgery

## 2018-07-25 ENCOUNTER — Encounter (HOSPITAL_COMMUNITY): Payer: Self-pay

## 2018-07-25 ENCOUNTER — Other Ambulatory Visit: Payer: Self-pay

## 2018-07-25 ENCOUNTER — Inpatient Hospital Stay (HOSPITAL_COMMUNITY)
Admission: RE | Admit: 2018-07-25 | Discharge: 2018-07-26 | DRG: 621 | Disposition: A | Discharge status: Home / Self Care | Payer: Commercial Managed Care - PPO | Source: Ambulatory Visit | Attending: General Surgery | Admitting: General Surgery

## 2018-07-25 DIAGNOSIS — F419 Anxiety disorder, unspecified: Secondary | ICD-10-CM | POA: Diagnosis present

## 2018-07-25 DIAGNOSIS — Z87410 Personal history of cervical dysplasia: Secondary | ICD-10-CM

## 2018-07-25 DIAGNOSIS — Z6841 Body Mass Index (BMI) 40.0 and over, adult: Secondary | ICD-10-CM | POA: Diagnosis not present

## 2018-07-25 DIAGNOSIS — Z87891 Personal history of nicotine dependence: Secondary | ICD-10-CM | POA: Diagnosis not present

## 2018-07-25 DIAGNOSIS — F329 Major depressive disorder, single episode, unspecified: Secondary | ICD-10-CM | POA: Diagnosis present

## 2018-07-25 HISTORY — PX: LAPAROSCOPIC GASTRIC SLEEVE RESECTION: SHX5895

## 2018-07-25 LAB — HEMOGLOBIN AND HEMATOCRIT, BLOOD
HCT: 38.8 % (ref 36.0–46.0)
Hemoglobin: 13.4 g/dL (ref 12.0–15.0)

## 2018-07-25 LAB — TYPE AND SCREEN
ABO/RH(D): O POS
ANTIBODY SCREEN: NEGATIVE

## 2018-07-25 LAB — PREGNANCY, URINE: PREG TEST UR: NEGATIVE

## 2018-07-25 SURGERY — GASTRECTOMY, SLEEVE, LAPAROSCOPIC
Anesthesia: General

## 2018-07-25 MED ORDER — BUPIVACAINE-EPINEPHRINE (PF) 0.25% -1:200000 IJ SOLN
INTRAMUSCULAR | Status: AC
  Filled 2018-07-25: qty 30

## 2018-07-25 MED ORDER — 0.9 % SODIUM CHLORIDE (POUR BTL) OPTIME
TOPICAL | Status: DC | PRN
  Administered 2018-07-25: 1000 mL

## 2018-07-25 MED ORDER — SIMETHICONE 80 MG PO CHEW
80.0000 mg | CHEWABLE_TABLET | Freq: Four times a day (QID) | ORAL | Status: DC | PRN
  Administered 2018-07-26: 80 mg via ORAL
  Filled 2018-07-25: qty 1

## 2018-07-25 MED ORDER — OXYCODONE HCL 5 MG/5ML PO SOLN
5.0000 mg | ORAL | Status: DC | PRN
  Administered 2018-07-25 – 2018-07-26 (×4): 5 mg via ORAL
  Filled 2018-07-25 (×4): qty 5

## 2018-07-25 MED ORDER — LIDOCAINE 20MG/ML (2%) 15 ML SYRINGE OPTIME
INTRAMUSCULAR | Status: DC | PRN
  Administered 2018-07-25: 1.5 mg/kg/h via INTRAVENOUS

## 2018-07-25 MED ORDER — SODIUM CHLORIDE 0.9 % IV SOLN
INTRAVENOUS | Status: DC
  Administered 2018-07-25 – 2018-07-26 (×3): via INTRAVENOUS

## 2018-07-25 MED ORDER — FENTANYL CITRATE (PF) 100 MCG/2ML IJ SOLN
INTRAMUSCULAR | Status: AC
  Filled 2018-07-25: qty 2

## 2018-07-25 MED ORDER — KETAMINE HCL 10 MG/ML IJ SOLN
INTRAMUSCULAR | Status: AC
  Filled 2018-07-25: qty 1

## 2018-07-25 MED ORDER — MIDAZOLAM HCL 2 MG/2ML IJ SOLN
INTRAMUSCULAR | Status: AC
  Filled 2018-07-25: qty 2

## 2018-07-25 MED ORDER — OXYCODONE HCL 5 MG/5ML PO SOLN
5.0000 mg | Freq: Once | ORAL | Status: DC | PRN
  Filled 2018-07-25: qty 5

## 2018-07-25 MED ORDER — ENSURE PRE-SURGERY PO LIQD
296.0000 mL | Freq: Once | ORAL | Status: DC
  Filled 2018-07-25: qty 296

## 2018-07-25 MED ORDER — APREPITANT 40 MG PO CAPS
40.0000 mg | ORAL_CAPSULE | ORAL | Status: AC
  Administered 2018-07-25: 40 mg via ORAL
  Filled 2018-07-25: qty 1

## 2018-07-25 MED ORDER — FENTANYL CITRATE (PF) 100 MCG/2ML IJ SOLN: 25.0000 ug | INTRAMUSCULAR | Status: DC | PRN

## 2018-07-25 MED ORDER — DEXAMETHASONE SODIUM PHOSPHATE 4 MG/ML IJ SOLN: 4.0000 mg | INTRAMUSCULAR | Status: DC

## 2018-07-25 MED ORDER — OXYCODONE HCL 5 MG PO TABS: 5.0000 mg | ORAL_TABLET | Freq: Once | ORAL | Status: DC | PRN

## 2018-07-25 MED ORDER — BUPIVACAINE LIPOSOME 1.3 % IJ SUSP
INTRAMUSCULAR | Status: DC | PRN
  Administered 2018-07-25: 20 mL

## 2018-07-25 MED ORDER — LACTATED RINGERS IV SOLN
INTRAVENOUS | Status: DC | PRN
  Administered 2018-07-25: 07:00:00 via INTRAVENOUS

## 2018-07-25 MED ORDER — MIDAZOLAM HCL 5 MG/5ML IJ SOLN
INTRAMUSCULAR | Status: DC | PRN
  Administered 2018-07-25: 2 mg via INTRAVENOUS

## 2018-07-25 MED ORDER — LACTATED RINGERS IV SOLN: INTRAVENOUS | Status: DC

## 2018-07-25 MED ORDER — SUGAMMADEX SODIUM 200 MG/2ML IV SOLN
INTRAVENOUS | Status: DC | PRN
  Administered 2018-07-25: 200 mg via INTRAVENOUS

## 2018-07-25 MED ORDER — FAMOTIDINE IN NACL 20-0.9 MG/50ML-% IV SOLN
20.0000 mg | Freq: Two times a day (BID) | INTRAVENOUS | Status: DC
  Administered 2018-07-25 (×2): 20 mg via INTRAVENOUS
  Filled 2018-07-25 (×2): qty 50

## 2018-07-25 MED ORDER — HEPARIN SODIUM (PORCINE) 5000 UNIT/ML IJ SOLN
5000.0000 [IU] | INTRAMUSCULAR | Status: AC
  Administered 2018-07-25: 5000 [IU] via SUBCUTANEOUS
  Filled 2018-07-25: qty 1

## 2018-07-25 MED ORDER — PROMETHAZINE HCL 25 MG/ML IJ SOLN
6.2500 mg | INTRAMUSCULAR | Status: DC | PRN
  Administered 2018-07-25: 6.25 mg via INTRAVENOUS

## 2018-07-25 MED ORDER — SCOPOLAMINE 1 MG/3DAYS TD PT72
1.0000 | MEDICATED_PATCH | TRANSDERMAL | Status: DC
  Administered 2018-07-25: 1.5 mg via TRANSDERMAL
  Filled 2018-07-25: qty 1

## 2018-07-25 MED ORDER — HYDRALAZINE HCL 20 MG/ML IJ SOLN: 10.0000 mg | INTRAMUSCULAR | Status: DC | PRN

## 2018-07-25 MED ORDER — ACETAMINOPHEN 500 MG PO TABS
1000.0000 mg | ORAL_TABLET | ORAL | Status: AC
  Administered 2018-07-25: 1000 mg via ORAL
  Filled 2018-07-25: qty 2

## 2018-07-25 MED ORDER — LACTATED RINGERS IR SOLN
Status: DC | PRN
  Administered 2018-07-25: 1000 mL

## 2018-07-25 MED ORDER — CHLORHEXIDINE GLUCONATE 4 % EX LIQD: 60.0000 mL | Freq: Once | CUTANEOUS | Status: DC

## 2018-07-25 MED ORDER — ROCURONIUM BROMIDE 10 MG/ML (PF) SYRINGE
PREFILLED_SYRINGE | INTRAVENOUS | Status: DC | PRN
  Administered 2018-07-25: 50 mg via INTRAVENOUS

## 2018-07-25 MED ORDER — ONDANSETRON HCL 4 MG/2ML IJ SOLN
4.0000 mg | INTRAMUSCULAR | Status: DC | PRN
  Administered 2018-07-25: 4 mg via INTRAVENOUS
  Filled 2018-07-25: qty 2

## 2018-07-25 MED ORDER — GABAPENTIN 300 MG PO CAPS
300.0000 mg | ORAL_CAPSULE | ORAL | Status: AC
  Administered 2018-07-25: 300 mg via ORAL
  Filled 2018-07-25: qty 1

## 2018-07-25 MED ORDER — KETAMINE HCL 10 MG/ML IJ SOLN
INTRAMUSCULAR | Status: DC | PRN
  Administered 2018-07-25: 10 mg via INTRAVENOUS
  Administered 2018-07-25: 15 mg via INTRAVENOUS
  Administered 2018-07-25: 25 mg via INTRAVENOUS

## 2018-07-25 MED ORDER — SODIUM CHLORIDE 0.9 % IV SOLN
2.0000 g | INTRAVENOUS | Status: DC
  Filled 2018-07-25: qty 2

## 2018-07-25 MED ORDER — BUPIVACAINE-EPINEPHRINE 0.25% -1:200000 IJ SOLN
INTRAMUSCULAR | Status: DC | PRN
  Administered 2018-07-25: 30 mL

## 2018-07-25 MED ORDER — GABAPENTIN 100 MG PO CAPS
200.0000 mg | ORAL_CAPSULE | Freq: Two times a day (BID) | ORAL | Status: DC
  Administered 2018-07-25: 200 mg via ORAL
  Filled 2018-07-25: qty 2

## 2018-07-25 MED ORDER — PROMETHAZINE HCL 25 MG/ML IJ SOLN
INTRAMUSCULAR | Status: AC
  Filled 2018-07-25: qty 1

## 2018-07-25 MED ORDER — PROPOFOL 10 MG/ML IV BOLUS
INTRAVENOUS | Status: AC
  Filled 2018-07-25: qty 20

## 2018-07-25 MED ORDER — ACETAMINOPHEN 160 MG/5ML PO SOLN
650.0000 mg | Freq: Four times a day (QID) | ORAL | Status: DC
  Administered 2018-07-25 – 2018-07-26 (×3): 650 mg via ORAL
  Filled 2018-07-25 (×3): qty 20.3

## 2018-07-25 MED ORDER — ENSURE PRE-SURGERY PO LIQD
592.0000 mL | Freq: Once | ORAL | Status: DC
  Filled 2018-07-25: qty 592

## 2018-07-25 MED ORDER — ONDANSETRON HCL 4 MG/2ML IJ SOLN
INTRAMUSCULAR | Status: DC | PRN
  Administered 2018-07-25: 4 mg via INTRAVENOUS

## 2018-07-25 MED ORDER — MORPHINE SULFATE (PF) 2 MG/ML IV SOLN
1.0000 mg | INTRAVENOUS | Status: DC | PRN
  Administered 2018-07-25: 2 mg via INTRAVENOUS
  Filled 2018-07-25: qty 1

## 2018-07-25 MED ORDER — PROPOFOL 10 MG/ML IV BOLUS
INTRAVENOUS | Status: DC | PRN
  Administered 2018-07-25: 200 mg via INTRAVENOUS

## 2018-07-25 MED ORDER — PREMIER PROTEIN SHAKE
2.0000 [oz_av] | ORAL | Status: DC
  Administered 2018-07-26 (×4): 2 [oz_av] via ORAL

## 2018-07-25 MED ORDER — ENOXAPARIN SODIUM 30 MG/0.3ML ~~LOC~~ SOLN
30.0000 mg | Freq: Once | SUBCUTANEOUS | Status: AC
  Administered 2018-07-25: 30 mg via SUBCUTANEOUS
  Filled 2018-07-25: qty 0.3

## 2018-07-25 MED ORDER — FENTANYL CITRATE (PF) 250 MCG/5ML IJ SOLN
INTRAMUSCULAR | Status: AC
  Filled 2018-07-25: qty 5

## 2018-07-25 MED ORDER — DEXAMETHASONE SODIUM PHOSPHATE 10 MG/ML IJ SOLN
INTRAMUSCULAR | Status: DC | PRN
  Administered 2018-07-25: 10 mg via INTRAVENOUS

## 2018-07-25 MED ORDER — ENOXAPARIN SODIUM 30 MG/0.3ML ~~LOC~~ SOLN: 30.0000 mg | Freq: Two times a day (BID) | SUBCUTANEOUS | Status: DC

## 2018-07-25 MED ORDER — ENOXAPARIN SODIUM 30 MG/0.3ML ~~LOC~~ SOLN
30.0000 mg | Freq: Two times a day (BID) | SUBCUTANEOUS | Status: DC
  Administered 2018-07-26: 30 mg via SUBCUTANEOUS
  Filled 2018-07-25: qty 0.3

## 2018-07-25 MED ORDER — FENTANYL CITRATE (PF) 100 MCG/2ML IJ SOLN
INTRAMUSCULAR | Status: DC | PRN
  Administered 2018-07-25: 100 ug via INTRAVENOUS
  Administered 2018-07-25 (×4): 50 ug via INTRAVENOUS

## 2018-07-25 SURGICAL SUPPLY — 67 items
APPLIER CLIP 5 13 M/L LIGAMAX5 (MISCELLANEOUS)
APPLIER CLIP ROT 13.4 12 LRG (CLIP) ×3
BAG LAPAROSCOPIC 12 15 PORT 16 (BASKET) ×1 IMPLANT
BAG RETRIEVAL 12/15 (BASKET) ×2
BAG RETRIEVAL 12/15MM (BASKET) ×1
BANDAGE ADH SHEER 1  50/CT (GAUZE/BANDAGES/DRESSINGS) ×18 IMPLANT
BENZOIN TINCTURE PRP APPL 2/3 (GAUZE/BANDAGES/DRESSINGS) ×3 IMPLANT
BLADE SURG SZ11 CARB STEEL (BLADE) ×3 IMPLANT
CABLE HIGH FREQUENCY MONO STRZ (ELECTRODE) ×3 IMPLANT
CHLORAPREP W/TINT 26ML (MISCELLANEOUS) ×6 IMPLANT
CLIP APPLIE 5 13 M/L LIGAMAX5 (MISCELLANEOUS) IMPLANT
CLIP APPLIE ROT 13.4 12 LRG (CLIP) ×1 IMPLANT
CLOSURE WOUND 1/2 X4 (GAUZE/BANDAGES/DRESSINGS) ×1
COVER SURGICAL LIGHT HANDLE (MISCELLANEOUS) ×3 IMPLANT
DRAIN CHANNEL 19F RND (DRAIN) IMPLANT
DRAPE UNIVERSAL PACK (DRAPES) ×3 IMPLANT
DRAPE UTILITY XL STRL (DRAPES) ×6 IMPLANT
ELECT REM PT RETURN 15FT ADLT (MISCELLANEOUS) ×3 IMPLANT
EVACUATOR SILICONE 100CC (DRAIN) IMPLANT
GAUZE SPONGE 4X4 12PLY STRL (GAUZE/BANDAGES/DRESSINGS) IMPLANT
GLOVE BIOGEL PI IND STRL 6 (GLOVE) ×2 IMPLANT
GLOVE BIOGEL PI IND STRL 6.5 (GLOVE) ×2 IMPLANT
GLOVE BIOGEL PI IND STRL 7.0 (GLOVE) ×2 IMPLANT
GLOVE BIOGEL PI INDICATOR 6 (GLOVE) ×4
GLOVE BIOGEL PI INDICATOR 6.5 (GLOVE) ×4
GLOVE BIOGEL PI INDICATOR 7.0 (GLOVE) ×4
GLOVE SURG SS PI 7.0 STRL IVOR (GLOVE) ×12 IMPLANT
GOWN STRL REUS W/TWL LRG LVL3 (GOWN DISPOSABLE) ×9 IMPLANT
GOWN STRL REUS W/TWL XL LVL3 (GOWN DISPOSABLE) ×6 IMPLANT
GRASPER SUT TROCAR 14GX15 (MISCELLANEOUS) ×3 IMPLANT
HANDLE STAPLE EGIA 4 XL (STAPLE) IMPLANT
HOVERMATT SINGLE USE (MISCELLANEOUS) IMPLANT
KIT BASIN OR (CUSTOM PROCEDURE TRAY) ×3 IMPLANT
MARKER SKIN DUAL TIP RULER LAB (MISCELLANEOUS) ×3 IMPLANT
NEEDLE SPNL 22GX3.5 QUINCKE BK (NEEDLE) ×3 IMPLANT
RELOAD EGIA 45 MED/THCK PURPLE (STAPLE) IMPLANT
RELOAD EGIA 60 MED/THCK PURPLE (STAPLE) IMPLANT
RELOAD EGIA BLACK ROTIC 45MM (STAPLE) IMPLANT
RELOAD STAPLER BLUE 60MM (STAPLE) ×3 IMPLANT
RELOAD STAPLER GOLD 60MM (STAPLE) ×1 IMPLANT
RELOAD STAPLER GREEN 60MM (STAPLE) ×1 IMPLANT
RELOAD TRI 2.0 60 XTHK VAS SUL (STAPLE) IMPLANT
SCISSORS LAP 5X45 EPIX DISP (ENDOMECHANICALS) IMPLANT
SET IRRIG TUBING LAPAROSCOPIC (IRRIGATION / IRRIGATOR) ×3 IMPLANT
SHEARS HARMONIC ACE PLUS 45CM (MISCELLANEOUS) ×3 IMPLANT
SLEEVE GASTRECTOMY 40FR VISIGI (MISCELLANEOUS) ×3 IMPLANT
SLEEVE XCEL OPT CAN 5 100 (ENDOMECHANICALS) ×6 IMPLANT
SOLUTION ANTI FOG 6CC (MISCELLANEOUS) ×3 IMPLANT
SPONGE LAP 18X18 RF (DISPOSABLE) ×3 IMPLANT
STAPLER ECHELON LONG 60 440 (INSTRUMENTS) ×3 IMPLANT
STAPLER RELOAD BLUE 60MM (STAPLE) ×9
STAPLER RELOAD GOLD 60MM (STAPLE) ×3
STAPLER RELOAD GREEN 60MM (STAPLE) ×3
STRIP CLOSURE SKIN 1/2X4 (GAUZE/BANDAGES/DRESSINGS) ×2 IMPLANT
SUT ETHIBOND 0 36 GRN (SUTURE) IMPLANT
SUT ETHILON 2 0 PS N (SUTURE) IMPLANT
SUT MNCRL AB 4-0 PS2 18 (SUTURE) ×3 IMPLANT
SUT VICRYL 0 TIES 12 18 (SUTURE) ×3 IMPLANT
SYR 20CC LL (SYRINGE) ×3 IMPLANT
SYR 50ML LL SCALE MARK (SYRINGE) ×3 IMPLANT
TOWEL OR 17X26 10 PK STRL BLUE (TOWEL DISPOSABLE) ×3 IMPLANT
TOWEL OR NON WOVEN STRL DISP B (DISPOSABLE) ×3 IMPLANT
TROCAR BLADELESS 15MM (ENDOMECHANICALS) ×3 IMPLANT
TROCAR BLADELESS OPT 5 100 (ENDOMECHANICALS) ×3 IMPLANT
TUBING CONNECTING 10 (TUBING) ×4 IMPLANT
TUBING CONNECTING 10' (TUBING) ×2
TUBING INSUF HEATED (TUBING) ×3 IMPLANT

## 2018-07-25 NOTE — Anesthesia Postprocedure Evaluation (Signed)
Anesthesia Post Note  Patient: Jenny Anderson  Procedure(s) Performed: LAPAROSCOPIC GASTRIC SLEEVE RESECTION, UPPER ENDO, ERAS Pathway (N/A )     Patient location during evaluation: PACU Anesthesia Type: General Level of consciousness: awake and alert Pain management: pain level controlled Vital Signs Assessment: post-procedure vital signs reviewed and stable Respiratory status: spontaneous breathing, nonlabored ventilation and respiratory function stable Cardiovascular status: blood pressure returned to baseline and stable Postop Assessment: no apparent nausea or vomiting Anesthetic complications: no    Last Vitals:  Vitals:   07/25/18 1021 07/25/18 1132  BP: 123/78 136/77  Pulse: (!) 51 (!) 49  Resp: 14 18  Temp: 36.8 C   SpO2: 99% 100%    Last Pain:  Vitals:   07/25/18 1030  TempSrc:   PainSc: [place] Rall[place]

## 2018-07-25 NOTE — Op Note (Signed)
Preoperative diagnosis: laparoscopic sleeve gastrectomy  Postoperative diagnosis: Same   Procedure: Upper endoscopy   Surgeon: Clovis Riley, M.D.  Anesthesia: Gen.   Description of procedure: The endoscopy was placed in the mouth and into the oropharynx and under endoscopic vision it was advanced to the esophagogastric junction.  The pouch was insufflated and no bleeding or bubbles were seen.  The GEJ was identified at 40cm from the teeth.  No bleeding or leaks were detected. The scope was withdrawn without difficulty.    Clovis Riley, M.D. General, Bariatric, & Minimally Invasive Surgery [facility]

## 2018-07-25 NOTE — H&P (Signed)
Jenny Anderson is an 32 y.o. female.   Chief Complaint: obesity HPI: 32 yo female with history of obesity. She has completed all requirements and is ready to present for bariatric surgery  Past Medical History:  Diagnosis Date  . Abnormal Pap smear of cervix    ok since LEEP to remove cancer cells  . Anxiety   . Depression 2003   good now  . Hearing difficulty of right ear   . Heart palpitations    reports it is due to her coffee drinking ; reports drinking now reducing to only 1 cup a day   . HPV in female 2012  . Infection    UTI  . Kidney stone 2007  . Migraines    sensitivity to light    Past Surgical History:  Procedure Laterality Date  . 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 COLPOSCOPY  2012  . DILATION AND CURETTAGE OF UTERUS    . LEEP  2012  . mirena     pt was pregnant with iud in place    Family History  Problem Relation Age of Onset  . Hyperlipidemia Mother   . Hypertension Mother   . Other Mother        hx of substance abuse  . Lung cancer Maternal Grandfather   . Lung cancer Paternal Grandfather   . Hyperlipidemia Paternal Grandfather   . Other Father        hx substance abuse  . Hypertension Maternal Grandmother   . Hyperlipidemia Maternal Grandmother   . Heart disease Maternal Grandmother   . Diabetes Maternal Grandmother   . Kidney disease Maternal Grandmother    Social History:  reports that she quit smoking about 2 years ago. She has never used smokeless tobacco. She reports that she drinks alcohol. She reports that she does not use drugs.  Allergies: No Known Allergies  Medications Prior to Admission  Medication Sig Dispense Refill  . levonorgestrel (MIRENA) 20 MCG/24HR IUD 1 each by Intrauterine route once.    Marland Kitchen ibuprofen (ADVIL,MOTRIN) 200 MG tablet Take 600 mg by mouth every 8 (eight) hours as needed (for pain.).      Results for orders placed or performed during the hospital encounter of 07/25/18 (from  the past 48 hour(s))  Pregnancy, urine STAT morning of surgery     Status: None   Collection Time: 07/25/18  5:53 AM  Result Value Ref Range   Preg Test, Ur NEGATIVE NEGATIVE    Comment:        THE SENSITIVITY OF THIS METHODOLOGY IS >20 mIU/mL. Performed at [facility], [place] [address], [place], [place] [address]    No results found.  Review of Systems  Constitutional: Negative for chills and fever.  HENT: Negative for hearing loss.   Eyes: Negative for blurred vision and double vision.  Respiratory: Negative for cough and hemoptysis.   Cardiovascular: Negative for chest pain and palpitations.  Gastrointestinal: Negative for abdominal pain, nausea and vomiting.  Genitourinary: Negative for dysuria and urgency.  Musculoskeletal: Negative for myalgias and neck pain.  Skin: Negative for itching and rash.  Neurological: Negative for dizziness, tingling and headaches.  Endo/Heme/Allergies: Does not bruise/bleed easily.  Psychiatric/Behavioral: Negative for depression and suicidal ideas.    Blood pressure (!) 151/94, pulse 62, temperature 98.3 F (36.8 C), temperature source Oral, resp. rate 18, height 5\' 4"  (1.626 m), weight 108 kg, last menstrual period 06/25/2018, SpO2  100 %, unknown if currently breastfeeding. Physical Exam  Vitals reviewed. Constitutional: She is oriented to person, place, and time. She appears well-developed and well-nourished.  HENT:  Head: Normocephalic and atraumatic.  Eyes: Pupils are equal, round, and reactive to light. Conjunctivae and EOM are normal.  Neck: Normal range of motion. Neck supple.  Cardiovascular: Normal rate and regular rhythm.  Respiratory: Effort normal and breath sounds normal.  GI: Soft. Bowel sounds are normal. She exhibits no distension. There is no tenderness.  Musculoskeletal: Normal range of motion.  Neurological: She is alert and oriented to person, place, and time.  Skin: Skin is warm and dry.  Psychiatric:  She has a normal mood and affect. Her behavior is normal.     Assessment/Plan 32 yo female with morbid obesity -lap sleeve gastrectomy -bariatric protocol  Mickeal Skinner, MD 07/25/2018, 7:01 AM

## 2018-07-25 NOTE — Progress Notes (Signed)
5 W advised pt will be in 1539 in 20 minutes

## 2018-07-25 NOTE — Progress Notes (Signed)
Pt ambulated with 2 assist to bed

## 2018-07-25 NOTE — Anesthesia Procedure Notes (Signed)
Procedure Name: Intubation Date/Time: 07/25/2018 7:27 AM Performed by: Anne Fu, CRNA Pre-anesthesia Checklist: Patient identified, Emergency Drugs available, Suction available and Patient being monitored Patient Re-evaluated:Patient Re-evaluated prior to induction Oxygen Delivery Method: Circle system utilized Preoxygenation: Pre-oxygenation with 100% oxygen Induction Type: IV induction Ventilation: Mask ventilation without difficulty Laryngoscope Size: Mac and 4 Grade View: Grade I Tube type: Oral Tube size: 7.5 mm Number of attempts: 1 Airway Equipment and Method: Stylet and Oral airway Placement Confirmation: ETT inserted through vocal cords under direct vision,  positive ETCO2 and breath sounds checked- equal and bilateral Secured at: 21 cm Tube secured with: Tape Dental Injury: Teeth and Oropharynx as per pre-operative assessment

## 2018-07-25 NOTE — Op Note (Signed)
Preop Diagnosis: Obesity Class III  Postop Diagnosis: same  Procedure performed: laparoscopic Sleeve Gastrectomy  Assitant: Romana Juniper  Indications:  The patient is a 32 y.o. year-old morbidly obese female who has been followed in the Bariatric Clinic as an outpatient. This patient was diagnosed with morbid obesity with a BMI of Body mass index is 40.85 kg/m. and significant co-morbidities including none.  The patient was counseled extensively in the Bariatric Outpatient Clinic and after a thorough explanation of the risks and benefits of surgery (including death from complications, bowel leak, infection such as peritonitis and/or sepsis, internal hernia, bleeding, need for blood transfusion, bowel obstruction, organ failure, pulmonary embolus, deep venous thrombosis, wound infection, incisional hernia, skin breakdown, and others entailed on the consent form) and after a compliant diet and exercise program, the patient was scheduled for an elective laparoscopic sleeve gastrectomy.  Description of Operation:  Following informed consent, the patient was taken to the operating room and placed on the operating table in the supine position.  She had previously received prophylactic antibiotics and subcutaneous heparin for DVT prophylaxis in the pre-op holding area.  After induction of general endotracheal anesthesia by the anesthesiologist, the patient underwent placement of sequential compression devices and an oro-gastric tube.  A timeout was confirmed by the surgery and anesthesia teams.  The patient was adequately padded at all pressure points and placed on a footboard to prevent slippage from the OR table during extremes of position during surgery.  She underwent a routine sterile prep and drape of her entire abdomen.    Next, A transverse incision was made under the left subcostal area and a 33mm optical viewing trocar was introduced into the peritoneal cavity. Pneumoperitoneum was applied with a  high flow and low pressure. A laparoscope was inserted to confirm placement. A extraperitoneal block was then placed at the lateral abdominal wall using exparel diluted with marcaine. 5 additional incisions were placed: 1 21mm trocar to the left of the midline. 1 additional 45mm trocar in the left lateral area, 1 43mm trocar in the right mid abdomen, 1 57mm trocar in the right subcostal area, and a Nathanson retractor was placed through a subxiphoid incision.  The fat pad at the GE junction was incised and the gastrodiaphragmatic ligament was divided using the Harmonic scalpel. Next, a hole was created through the lesser omentum along the greater curve of the stomach to enter the lesser sac. The vessels along the greater omentum were  Then ligated and divided using the Harmonic scalpel moving towards the spleen and then short gastric vessels were ligated and divided in the same fashion to fully mobilize the fundus. The left crus was identified to ensure completion of the dissection. Next the antrum was measured and dissection continued inferiorly along the greater curve towards the pylorus and stopped 6cm from the pylorus.   A 40Fr ViSiGi dilator was placed into the esophgaus and along the lesser curve of the stomach and placed on suction. 1 non-reinforced 69mm Green load echelon stapler(s) followed by 1 31mm Gold load echelon stapler(s) followed by 3 27mm blue load echelon stapler(s) were used to make the resection along the antrum being sure to stay well away from the angularis by angling the jaws of the stapler towards the greater curve and later completing the resection staying along the [place] and ensuring the fundus was not retained by appropriately retracting it lateral. Air was inserted through the [place] to perform a leak test showing no bubbles and a neutral lie  of the stomach.  The assistant then went and performed an upper endoscopy and leak test. Leak was negative. There were a few areas of small  bleeding. Hemostasis was applied with 8 large clips. No bubbles were seen and the sleeve and antrum distended appropriately. The specimen was then placed in an endocatch bag and removed by the 24mm port. The fascia of the 53mm port was closed with a 0 vicryl by suture passer. Hemostasis was ensured. Pneumoperitoneum was evacuated, all ports were removed and all incisions closed with 4-0 monocryl suture in subcuticular fashion. Steristrips and bandaids were put in place for dressing. The patient awoke from anesthesia and was brought to pacu in stable condition. All counts were correct.  Estimated blood loss: 25ml  Specimens:  Sleeve gastrectomy  Local Anesthesia: 50 ml Exparel:0.5% Marcaine mix  Post-Op Plan:       Pain Management: PO, prn      Antibiotics: Prophylactic      Anticoagulation: Prophylactic, Starting now      Post Op Studies/Consults: Not applicable      Intended Discharge: within 48h      Intended Outpatient Follow-Up: Two Week      Intended Outpatient Studies: Not Applicable      Other: Not Applicable  Images:      Arta Bruce Kinsinger

## 2018-07-25 NOTE — Progress Notes (Signed)
Patient sleeping family at bedside.  Discussed patient progression with bedside RN.  No questions at this time.  Contact information place on patient white board.

## 2018-07-25 NOTE — Discharge Instructions (Signed)
° ° ° °GASTRIC BYPASS/SLEEVE ° Home Care Instructions ° ° These instructions are to help you care for yourself when you go home. ° °Call: If you have any problems. °• Call [phone] and ask for the surgeon on call °• If you need immediate help, come to the ER at .  °• Tell the ER staff that you are a new post-op gastric bypass or gastric sleeve patient °  °Signs and symptoms to report: • Severe vomiting or nausea °o If you cannot keep down clear liquids for longer than 1 day, call your surgeon  °• Abdominal pain that does not get better after taking your pain medication °• Fever over 100.4° F with chills °• Heart beating over 100 beats a minute °• Shortness of breath at rest °• Chest pain °•  Redness, swelling, drainage, or foul odor at incision (surgical) sites °•  If your incisions open or pull apart °• Swelling or pain in calf (lower leg) °• Diarrhea (Loose bowel movements that happen often), frequent watery, uncontrolled bowel movements °• Constipation, (no bowel movements for 3 days) if this happens: Pick one °o Milk of Magnesia, 2 tablespoons by mouth, 3 times a day for 2 days if needed °o Stop taking Milk of Magnesia once you have a bowel movement °o Call your doctor if constipation continues °Or °o Miralax  (instead of Milk of Magnesia) following the label instructions °o Stop taking Miralax once you have a bowel movement °o Call your doctor if constipation continues °• Anything you think is not normal °  °Normal side effects after surgery: • Unable to sleep at night or unable to focus °• Irritability or moody °• Being tearful (crying) or depressed °These are common complaints, possibly related to your anesthesia medications that put you to sleep, stress of surgery, and change in lifestyle.  This usually goes away a few weeks after surgery.  If these feelings continue, call your primary care doctor. °  °Wound Care: You may have surgical glue, steri-strips, or staples over your incisions after  surgery °• Surgical glue:  Looks like a clear film over your incisions and will wear off a little at a time °• Steri-strips: Strips of tape over your incisions. You may notice a yellowish color on the skin under the steri-strips. This is used to make the   steri-strips stick better. Do not pull the steri-strips off - let them fall off °• Staples: Staples may be removed before you leave the hospital °o If you go home with staples, call [facility], [phone] at for an appointment with your surgeon’s nurse to have staples removed 10 days after surgery. °• Showering: You may shower two (2) days after your surgery unless your surgeon tells you differently °o Wash gently around incisions with warm soapy water, rinse well, and gently pat dry  °o No tub baths until staples are removed, steri-strips fall off or glue is gone.  °  °Medications: • Medications should be liquid or crushed if larger than the size of a dime °• Extended release pills (medication that release a little bit at a time through the day) should NOT be crushed or cut. (examples include XL, ER, DR, SR) °• Depending on the size and number of medications you take, you may need to space (take a few throughout the day)/change the time you take your medications so that you do not over-fill your pouch (smaller stomach) °• Make sure you follow-up with your primary care doctor to   make medication changes needed during rapid weight loss and life-style changes °• If you have diabetes, follow up with the doctor that orders your diabetes medication(s) within one week after surgery and check your blood sugar regularly. °• Do not drive while taking prescription pain medication  °• It is ok to take Tylenol by the bottle instructions with your pain medicine or instead of your pain medicine as needed.  DO NOT TAKE NSAIDS (EXAMPLES OF NSAIDS:  IBUPROFREN/ NAPROXEN)  °Diet:                    First 2 Weeks ° You will see the dietician t about two (2) weeks  after your surgery. The dietician will increase the types of foods you can eat if you are handling liquids well: °• If you have severe vomiting or nausea and cannot keep down clear liquids lasting longer than 1 day, call your surgeon @ ([phone]) °Protein Shake °• Drink at least 2 ounces of shake 5-6 times per day °• Each serving of protein shakes (usually 8 - 12 ounces) should have: °o 15 grams of protein  °o And no more than 5 grams of carbohydrate  °• Goal for protein each day: °o Men = 80 grams per day °o Women = 60 grams per day °• Protein powder may be added to fluids such as non-fat milk or Lactaid milk or unsweetened Soy/Almond milk (limit to 35 grams added protein powder per serving) ° °Hydration °• Slowly increase the amount of water and other clear liquids as tolerated (See Acceptable Fluids) °• Slowly increase the amount of protein shake as tolerated  °•  Sip fluids slowly and throughout the day.  Do not use straws. °• May use sugar substitutes in small amounts (no more than 6 - 8 packets per day; i.e. Splenda) ° °Fluid Goal °• The first goal is to drink at least 8 ounces of protein shake/drink per day (or as directed by the nutritionist); some examples of protein shakes are Syntrax Nectar, Adkins Advantage, EAS Edge HP, and Unjury. See handout from pre-op Bariatric Education Class: °o Slowly increase the amount of protein shake you drink as tolerated °o You may find it easier to slowly sip shakes throughout the day °o It is important to get your proteins in first °• Your fluid goal is to drink 64 - 100 ounces of fluid daily °o It may take a few weeks to build up to this °• 32 oz (or more) should be clear liquids  °And  °• 32 oz (or more) should be full liquids (see below for examples) °• Liquids should not contain sugar, caffeine, or carbonation ° °Clear Liquids: °• Water or Sugar-free flavored water (i.e. Fruit H2O, Propel) °• Decaffeinated coffee or tea (sugar-free) °• Crystal Lite, Wyler’s Lite,  Minute Maid Lite °• Sugar-free Jell-O °• Bouillon or broth °• Sugar-free Popsicle:   *Less than 20 calories each; Limit 1 per day ° °Full Liquids: °Protein Shakes/Drinks + 2 choices per day of other full liquids °• Full liquids must be: °o No More Than 15 grams of Carbs per serving  °o No More Than 3 grams of Fat per serving °• Strained low-fat cream soup (except Cream of Potato or Tomato) °• Non-Fat milk °• Fat-free Lactaid Milk °• Unsweetened Soy Or Unsweetened Almond Milk °• Low Sugar yogurt (Dannon Lite & Fit, Greek yogurt; Oikos Triple Zero; Chobani Simply 100; Yoplait 100 calorie Greek - No Fruit on the Bottom) ° °  °Vitamins   and Minerals • Start 1 day after surgery unless otherwise directed by your surgeon °• 2 Chewable Bariatric Specific Multivitamin / Multimineral Supplement with iron (Example: Bariatric Advantage Multi EA) °• Chewable Calcium with Vitamin D-3 °(Example: 3 Chewable Calcium Plus 600 with Vitamin D-3) °o Take 500 mg three (3) times a day for a total of 1500 mg each day °o Do not take all 3 doses of calcium at one time as it may cause constipation, and you can only absorb 500 mg  at a time  °o Do not mix multivitamins containing iron with calcium supplements; take 2 hours apart °• Menstruating women and those with a history of anemia (a blood disease that causes weakness) may need extra iron °o Talk with your doctor to see if you need more iron °• Do not stop taking or change any vitamins or minerals until you talk to your dietitian or surgeon °• Your Dietitian and/or surgeon must approve all vitamin and mineral supplements °  °Activity and Exercise: Limit your physical activity as instructed by your doctor.  It is important to continue walking at home.  During this time, use these guidelines: °• Do not lift anything greater than ten (10) pounds for at least two (2) weeks °• Do not go back to work or drive until your surgeon says you can °• You may have sex when you feel comfortable  °o It is  VERY important for female patients to use a reliable birth control method; fertility often increases after surgery  °o All hormonal birth control will be ineffective for 30 days after surgery due to medications given during surgery a barrier method must be used. °o Do not get pregnant for at least 18 months °• Start exercising as soon as your doctor tells you that you can °o Make sure your doctor approves any physical activity °• Start with a simple walking program °• Walk 5-15 minutes each day, 7 days per week.  °• Slowly increase until you are walking 30-45 minutes per day °Consider joining our BELT program. [phone] or email [email or web address] °  °Special Instructions Things to remember: °• Use your CPAP when sleeping if this applies to you ° °• [facility] has two free Bariatric Surgery Support Groups that meet monthly °o The 3rd Thursday of each month, 6 pm, [facility]  °o The 2nd Friday of each month, 11:45 am in the private dining room in the basement of  °• It is very important to keep all follow up appointments with your surgeon, dietitian, primary care physician, and behavioral health practitioner °• Routine follow up schedule with your surgeon include appointments at 2-3 weeks, 6-8 weeks, 6 months, and 1 year at a minimum.  Your surgeon may request to see you more often.   °o After the first year, please follow up with your bariatric surgeon and dietitian at least once a year in order to maintain best weight loss results °[facility]: [phone] °[facility]: [phone] °Bariatric Nurse Coordinator: [phone] °  °   Reviewed and Endorsed  °by [facility] Patient Education Committee, June, 2016 °Edits Approved: Aug, 2018 ° ° ° °

## 2018-07-25 NOTE — Progress Notes (Signed)
PHARMACY CONSULT FOR:  Risk Assessment for Post-Discharge VTE Following Bariatric Surgery  Post-Discharge VTE Risk Assessment: This patient's probability of 30-day post-discharge VTE is increased due to:   Female    Age >/=60 years    BMI >/=50 kg/m2    CHF    Dyspnea at Rest    Paraplegia  X  Non-gastric-band surgery    Operation Time >/=3 hr    Return to OR     Length of Stay >/= 3 d   Predicted probability of 30-day post-discharge VTE: 0.16%  Other patient-specific factors to consider: none   Recommendation for Discharge: No pharmacological prophylaxis post-discharge   Jenny Anderson is a 32 y.o. female who underwent  laparoscopic sleeve gastrectomy on target organ damage 07/25/18.   Case start: 0748 Case end: 0844   No Known Allergies  Patient Measurements: Height: 5\' 4"  (162.6 cm) Weight: 238 lb (108 kg) IBW/kg (Calculated) : 54.7 Body mass index is 40.85 kg/m.  No results for input(s): WBC, HGB, HCT, PLT, APTT, CREATININE, LABCREA, CREATININE, CREAT24HRUR, MG, PHOS, ALBUMIN, PROT, ALBUMIN, AST, ALT, ALKPHOS, BILITOT, BILIDIR, IBILI in the last 72 hours. Estimated Creatinine Clearance: 123.4 mL/min (by C-G formula based on SCr of 0.7 mg/dL).    Past Medical History:  Diagnosis Date  . Abnormal Pap smear of cervix    ok since LEEP to remove cancer cells  . Anxiety   . Depression 2003   good now  . Hearing difficulty of right ear   . Heart palpitations    reports it is due to her coffee drinking ; reports drinking now reducing to only 1 cup a day   . HPV in female 2012  . Infection    UTI  . Kidney stone 2007  . Migraines    sensitivity to light     Medications Prior to Admission  Medication Sig Dispense Refill Last Dose  . levonorgestrel (MIRENA) 20 MCG/24HR IUD 1 each by Intrauterine route once.   in place  . ibuprofen (ADVIL,MOTRIN) 200 MG tablet Take 600 mg by mouth every 8 (eight) hours as needed (for pain.).   More than a month at Unknown  time       [employer] 07/25/2018,7:44 AM

## 2018-07-25 NOTE — Transfer of Care (Signed)
Immediate Anesthesia Transfer of Care Note  Patient: Jenny Anderson  Procedure(s) Performed: Procedure(s): LAPAROSCOPIC GASTRIC SLEEVE RESECTION, UPPER ENDO, ERAS Pathway (N/A)  Patient Location: PACU  Anesthesia Type:General  Level of Consciousness:  sedated, patient cooperative and responds to stimulation  Airway & Oxygen Therapy:Patient Spontanous Breathing and Patient connected to face mask oxgen  Post-op Assessment:  Report given to PACU RN and Post -op Vital signs reviewed and stable  Post vital signs:  Reviewed and stable  Last Vitals:  Vitals:   07/25/18 0544 07/25/18 0856  BP: (!) 151/94   Pulse: 62   Resp: 18   Temp: 36.8 C 36.9 C  SpO2: 848% 350%    Complications: No apparent anesthesia complications

## 2018-07-26 ENCOUNTER — Encounter (HOSPITAL_COMMUNITY): Payer: Self-pay | Admitting: General Surgery

## 2018-07-26 LAB — COMPREHENSIVE METABOLIC PANEL
ALT: 16 U/L (ref 0–44)
AST: 19 U/L (ref 15–41)
Albumin: 3.6 g/dL (ref 3.5–5.0)
Alkaline Phosphatase: 57 U/L (ref 38–126)
Anion gap: 10 (ref 5–15)
BILIRUBIN TOTAL: 0.6 mg/dL (ref 0.3–1.2)
BUN: 9 mg/dL (ref 6–20)
CO2: 22 mmol/L (ref 22–32)
Calcium: 8.5 mg/dL — ABNORMAL LOW (ref 8.9–10.3)
Chloride: 107 mmol/L (ref 98–111)
Creatinine, Ser: 0.6 mg/dL (ref 0.44–1.00)
Glucose, Bld: 122 mg/dL — ABNORMAL HIGH (ref 70–99)
POTASSIUM: 4.1 mmol/L (ref 3.5–5.1)
Sodium: 139 mmol/L (ref 135–145)
TOTAL PROTEIN: 6.3 g/dL — AB (ref 6.5–8.1)

## 2018-07-26 LAB — CBC WITH DIFFERENTIAL/PLATELET
BASOS ABS: 0 10*3/uL (ref 0.0–0.1)
Basophils Relative: 0 %
EOS PCT: 0 %
Eosinophils Absolute: 0 10*3/uL (ref 0.0–0.7)
HEMATOCRIT: 35.7 % — AB (ref 36.0–46.0)
Hemoglobin: 12.4 g/dL (ref 12.0–15.0)
LYMPHS ABS: 1.1 10*3/uL (ref 0.7–4.0)
Lymphocytes Relative: 8 %
MCH: 31.3 pg (ref 26.0–34.0)
MCHC: 34.7 g/dL (ref 30.0–36.0)
MCV: 90.2 fL (ref 78.0–100.0)
MONOS PCT: 5 %
Monocytes Absolute: 0.7 10*3/uL (ref 0.1–1.0)
NEUTROS ABS: 11.7 10*3/uL — AB (ref 1.7–7.7)
Neutrophils Relative %: 87 %
PLATELETS: 338 10*3/uL (ref 150–400)
RBC: 3.96 MIL/uL (ref 3.87–5.11)
RDW: 12.7 % (ref 11.5–15.5)
WBC: 13.5 10*3/uL — ABNORMAL HIGH (ref 4.0–10.5)

## 2018-07-26 MED ORDER — GABAPENTIN 300 MG PO CAPS: 300.0000 mg | ORAL_CAPSULE | Freq: Two times a day (BID) | ORAL | 0 refills | Status: AC

## 2018-07-26 MED ORDER — PANTOPRAZOLE SODIUM 40 MG PO TBEC: 40.0000 mg | DELAYED_RELEASE_TABLET | Freq: Every day | ORAL | 0 refills | Status: DC

## 2018-07-26 MED ORDER — TRAMADOL HCL 50 MG PO TABS: 50.0000 mg | ORAL_TABLET | Freq: Four times a day (QID) | ORAL | 0 refills | Status: DC | PRN

## 2018-07-26 MED ORDER — ONDANSETRON 4 MG PO TBDP: 4.0000 mg | ORAL_TABLET | Freq: Four times a day (QID) | ORAL | 0 refills | Status: DC | PRN

## 2018-07-26 NOTE — Discharge Summary (Signed)
Physician Discharge Summary  Jenny Anderson DOB: 07/09/1986 DOA: 07/25/2018  PCP: Patient, No Pcp Per  Admit date: 07/25/2018 Discharge date: 07/26/2018  Recommendations for Outpatient Follow-up:  1.  (include homehealth, outpatient follow-up instructions, specific recommendations for PCP to follow-up on, etc.)  Follow-up Information    Raun Routh, Arta Bruce, MD Follow up.   Specialty:  General Surgery Contact information: [place] [phone]          Discharge Diagnoses:  Active Problems:   Morbid obesity ([place])   Surgical Procedure: Laparoscopic Sleeve Gastrectomy, upper endoscopy  Discharge Condition: Good Disposition: Home  Diet recommendation: Postoperative sleeve gastrectomy diet (liquids only)  Filed Weights   07/25/18 0544  Weight: 108 kg     Hospital Course:  The patient was admitted after undergoing laparoscopic sleeve gastrectomy. POD 0 she ambulated well. POD 1 she was started on the water diet protocol and tolerated 400 ml in the first shift. Once meeting the water amount she was advanced to bariatric protein shakes which they tolerated and were discharged home POD 1.  Treatments: surgery: laparoscopic sleeve gastrectomy  Discharge Instructions  Discharge Instructions    Ambulate hourly while awake   Complete by:  As directed    Call MD for:  difficulty breathing, headache or visual disturbances   Complete by:  As directed    Call MD for:  persistant dizziness or light-headedness   Complete by:  As directed    Call MD for:  persistant nausea and vomiting   Complete by:  As directed    Call MD for:  redness, tenderness, or signs of infection (pain, swelling, redness, odor or green/yellow discharge around incision site)   Complete by:  As directed    Call MD for:  severe uncontrolled pain   Complete by:  As directed    Call MD for:  temperature >101 F   Complete by:  As directed    Diet  bariatric full liquid   Complete by:  As directed    Discharge wound care:   Complete by:  As directed    Remove Bandaids tomorrow, ok to shower tomorrow. Steristrips may fall off in 1-3 weeks.   Incentive spirometry   Complete by:  As directed    Perform hourly while awake     Allergies as of 07/26/2018   No Known Allergies     Medication List    STOP taking these medications   ibuprofen 200 MG tablet Commonly known as:  ADVIL,MOTRIN     TAKE these medications   gabapentin 300 MG capsule Commonly known as:  NEURONTIN Take 1 capsule (300 mg total) by mouth 2 (two) times daily. for 30 days   levonorgestrel 20 MCG/24HR IUD Commonly known as:  MIRENA 1 each by Intrauterine route once. Notes to patient:  IUD will be ineffective for 30 days post surgery.  Barrier form of birth control (condoms) must be used during those 30 days.   ondansetron 4 MG disintegrating tablet Commonly known as:  ZOFRAN-ODT Take 1 tablet (4 mg total) by mouth every 6 (six) hours as needed for nausea or vomiting.   pantoprazole 40 MG tablet Commonly known as:  PROTONIX Take 1 tablet (40 mg total) by mouth daily.   traMADol 50 MG tablet Commonly known as:  ULTRAM Take 1 tablet (50 mg total) by mouth every 6 (six) hours as needed (pain).            Discharge  Care Instructions  (From admission, onward)         Start     Ordered   07/26/18 0000  Discharge wound care:    Comments:  Remove Bandaids tomorrow, ok to shower tomorrow. Steristrips may fall off in 1-3 weeks.   07/26/18 0730         Follow-up Information    Maron Stanzione, Arta Bruce, MD Follow up.   Specialty:  General Surgery Contact information: [place] [address] [phone]            The results of significant diagnostics from this hospitalization (including imaging, microbiology, ancillary and laboratory) are listed below for reference.    Significant Diagnostic Studies: No results  found.  Labs: Basic Metabolic Panel: Recent Labs  Lab 07/26/18 0353  NA 139  K 4.1  CL 107  CO2 22  GLUCOSE 122*  BUN 9  CREATININE 0.60  CALCIUM 8.5*   Liver Function Tests: Recent Labs  Lab 07/26/18 0353  AST 19  ALT 16  ALKPHOS 57  BILITOT 0.6  PROT 6.3*  ALBUMIN 3.6    CBC: Recent Labs  Lab 07/25/18 1104 07/26/18 0353  WBC  --  13.5*  NEUTROABS  --  11.7*  HGB 13.4 12.4  HCT 38.8 35.7*  MCV  --  90.2  PLT  --  338    CBG: No results for input(s): GLUCAP in the last 168 hours.  Active Problems:   Morbid obesity [facility])   [facility] Bariatric VTE risk calculator : 0.07% risk of post operative VTE no chemical prophylaxis recommended ([email or web address])  Time coordinating discharge: 36min

## 2018-07-26 NOTE — Progress Notes (Signed)

## 2018-07-26 NOTE — Progress Notes (Signed)
Discharge instructions reviewed with patient. All questions answered. Patient wheeled to vehicle with belongings by nurse tech. 

## 2018-07-26 NOTE — Progress Notes (Signed)
Patient alert and oriented, Post op day 1.  Provided support and encouragement.  Encouraged pulmonary toilet, ambulation and small sips of liquids. Completed 12 ounces of bari clear fluid and 6 ounces of protein shake.  All questions answered.  Will continue to monitor. 

## 2018-08-01 ENCOUNTER — Telehealth (HOSPITAL_COMMUNITY): Payer: Self-pay

## 2018-08-01 NOTE — Telephone Encounter (Signed)
Patient called to discuss post bariatric surgery follow up questions.  See below:   1.  Tell me about your pain and pain management?not really having much pain except on larger incision which using ice pack for  2.  Let's talk about fluid intake.  How much total fluid are you taking in?52 ounces of fluid  3.  How much protein have you taken in the last 2 days?60 grams   4.  Have you had nausea?  Tell me about when have experienced nausea and what you did to help?no nasuea  5.  Has the frequency or color changed with your urine?urine clear in color  6.  Tell me what your incisions look like?incisions are intact  7.  Have you been passing gas? BM?passing gas and having bm  8.  If a problem or question were to arise who would you call?  Do you know contact numbers for [place], [facility], and [facility]?aware of how to contact all services 9.  How has the walking going?walking regularly  10.  How are your vitamins and calcium going?  How are you taking them?taking vitamin and calcium as directed at discharge

## 2018-08-09 ENCOUNTER — Encounter: Payer: Commercial Managed Care - PPO | Attending: General Surgery | Admitting: Skilled Nursing Facility1

## 2018-08-09 DIAGNOSIS — F329 Major depressive disorder, single episode, unspecified: Secondary | ICD-10-CM | POA: Insufficient documentation

## 2018-08-09 DIAGNOSIS — F17200 Nicotine dependence, unspecified, uncomplicated: Secondary | ICD-10-CM | POA: Diagnosis not present

## 2018-08-09 DIAGNOSIS — Z6841 Body Mass Index (BMI) 40.0 and over, adult: Secondary | ICD-10-CM | POA: Insufficient documentation

## 2018-08-09 DIAGNOSIS — E669 Obesity, unspecified: Secondary | ICD-10-CM

## 2018-08-09 DIAGNOSIS — Z713 Dietary counseling and surveillance: Secondary | ICD-10-CM | POA: Diagnosis not present

## 2018-08-16 ENCOUNTER — Encounter: Payer: Self-pay | Admitting: Skilled Nursing Facility1

## 2018-08-16 ENCOUNTER — Telehealth: Payer: Self-pay | Admitting: Skilled Nursing Facility1

## 2018-08-16 NOTE — Progress Notes (Signed)
Bariatric Class:  Appt start time: 1530 end time:  1630.  2 Week Post-Operative Nutrition Class  Patient was seen on 08/10/2018 for Post-Operative Nutrition education at [facility].   Surgery date: 07/25/2018 Surgery type: sleeve Start weight at [facility]: 234.4 Weight today: 220.4  TANITA  BODY COMP RESULTS  08/10/2018   BMI (kg/m^2) 37.8   Fat Mass (lbs) 106.6   Fat Free Mass (lbs) 113.8   Total Body Water (lbs) 83.2   The following the learning objectives were met by the patient during this course:  Identifies Phase 3A (Soft, High Proteins) Dietary Goals and will begin from 2 weeks post-operatively to 2 months post-operatively  Identifies appropriate sources of fluids and proteins   States protein recommendations and appropriate sources post-operatively  Identifies the need for appropriate texture modifications, mastication, and bite sizes when consuming solids  Identifies appropriate multivitamin and calcium sources post-operatively  Describes the need for physical activity post-operatively and will follow MD recommendations  States when to call healthcare provider regarding medication questions or post-operative complications  Handouts given during class include:  Phase 3A: Soft, High Protein Diet Handout  Follow-Up Plan: Patient will follow-up at [facility] in 6 weeks for 2 month post-op nutrition visit for diet advancement per MD.

## 2018-08-16 NOTE — Telephone Encounter (Signed)
Dietitian called pt to assess her diet progression.   Pt states everything has been going well trying deli meat and yogurt and egg without issue. Pt states she is struggling to get water in due to not eating and drinking. Pt states in 30 minutes she is eating 1 ounce. Pt states she she thinks she was taking too big of bites. Pt states she is getting in 40 ounces of fluid and 50-65 grams of protein. Pt states she is doing protein in her tea to start her day. Pt states she she feels everything is getting better.  Dietitian advised pt to call [facility] if she continues to struggle getting in more than 1 ounce of protein or she feels she is tolerating less as she progresses into next week.

## 2018-09-20 ENCOUNTER — Encounter: Payer: Commercial Managed Care - PPO | Attending: General Surgery | Admitting: Skilled Nursing Facility1

## 2018-09-20 ENCOUNTER — Encounter: Payer: Self-pay | Admitting: Skilled Nursing Facility1

## 2018-09-20 DIAGNOSIS — Z713 Dietary counseling and surveillance: Secondary | ICD-10-CM | POA: Insufficient documentation

## 2018-09-20 DIAGNOSIS — Z6841 Body Mass Index (BMI) 40.0 and over, adult: Secondary | ICD-10-CM | POA: Diagnosis not present

## 2018-09-20 DIAGNOSIS — F329 Major depressive disorder, single episode, unspecified: Secondary | ICD-10-CM | POA: Insufficient documentation

## 2018-09-20 DIAGNOSIS — F17200 Nicotine dependence, unspecified, uncomplicated: Secondary | ICD-10-CM | POA: Diagnosis not present

## 2018-09-20 NOTE — Progress Notes (Signed)
Follow-up visit:  8 Weeks Post-Operative sleeve Surgery  Primary concerns today: Post-operative Bariatric Surgery Nutrition Management.  Pt states she has been eating in the middle of the night after having gone to bed. Pt states she has not been sleeping through the night due to needing to urinate.  Pt did ask about calories and carbohydrates but stated she is trying not to focus on the diet mentality.   Surgery date: 07/25/2018 Surgery type: sleeve Start weight at [facility]: 234.4 Weight today:201.6 Wt change: 18.8  TANITA  BODY COMP RESULTS  08/10/2018 09/20/2018   BMI (kg/m^2) 37.8 34.6   Fat Mass (lbs) 106.6 90.6   Fat Free Mass (lbs) 113.8 111   Total Body Water (lbs) 83.2 80.6   24-hr recall: B (AM): greek yogurt or sometimes a protein shake Snk (AM): scrambled egg L (PM): ham or shrimp or soy burger Snk (PM): half quest bar or protein pudding or cheese chips D (PM): ham or shrimp or soy burger or tuna  Snk (PM): protein pudding or other half quest bar  Fluid intake: water 50oz, gatorade zero Estimated total protein intake: 60+  Medications: see list Supplementation: procare capsule and calcium   Using straws: no Drinking while eating: no Having you been chewing well: yes Chewing/swallowing difficulties: no Changes in vision: no Changes to mood/headaches: no Hair loss/Cahnges to skin/Changes to nails: no Any difficulty focusing or concentrating: no Sweating: no Dizziness/Lightheaded: no Palpitations: no  Carbonated beverages: no N/V/D/C/GAS: every other day a bowel movement  Abdominal Pain: no Dumping syndrome: no  Recent physical activity: walk/run every other day 2 miles   Progress Towards Goal(s):  In progress.  Handouts given during visit include:  Non starchy vegetables + protein    Nutritional Diagnosis:  [place]-3.3 Overweight/obesity related to past poor dietary habits and physical inactivity as evidenced by patient w/ recent sleeve surgery following  dietary guidelines for continued weight loss.    Intervention:  Nutrition cousneling. Pts diet was advanced to the next phase now including non starchy veggies. Due to the bodies need for essential vitamins, minerals, and fats the pt was educated on the need to consume a certain amount of calories as well as certain nutrients daily. Pt was educated on the need for daily physical activity and to reach a goal of at least 150 minutes of moderate to vigorous physical activity as directed by their physician due to such benefits as increased musculature and improved lab values.   Goals: -Stop drinking within 1-2 hours of going to bed -If waking to pee do not eat anything  -Get you a water bottle to carry your water on you to help you reach 64 fluid ounces  -Aim to have vegetables at least 2 times a day 7 days a week  -Today and tomarrow have soft cooked vegetables then if tolerated switch to including raw such as salad   Teaching Method Utilized:  Visual Auditory Hands on  Barriers to learning/adherence to lifestyle change: none stated  Demonstrated degree of understanding via:  Teach Back   Monitoring/Evaluation:  Dietary intake, exercise, and body weight.

## 2018-09-20 NOTE — Patient Instructions (Addendum)
-  Stop drinking within 1-2 hours of going to bed  -If waking to pee do not eat anything   -Get you a water bottle to carry your water on you to help you reach 64 fluid ounces   -Aim to have vegetables at least 2 times a day 7 days a week   -Today and tomarrow have soft cooked vegetables then if tolerated switch to including raw such as salad

## 2019-01-24 ENCOUNTER — Encounter: Payer: Commercial Managed Care - PPO | Attending: General Surgery | Admitting: Skilled Nursing Facility1

## 2019-01-25 NOTE — Progress Notes (Signed)
Follow-up visit:  Post-Operative sleeve Surgery  Medical Nutrition Therapy:  Appt start time: 6:00pm end time:  7:00pm  Primary concerns today: Post-operative Bariatric Surgery Nutrition Management 6 Month Post-Op Class  Pt states she really wants fruit: To be discussed at next appt.   Surgery date: 07/25/2018 Surgery type: sleeve Start weight at [facility]: 234.4 Weight today: 168 Wt change: 33.6  TANITA  BODY COMP RESULTS  01/24/2019   BMI (kg/m^2) 28.8   Fat Mass (lbs) 58.8   Fat Free Mass (lbs) 109.2   Total Body Water (lbs) 77.8    Information Reviewed/ Discussed During Appointment: -Review of composition scale numbers -Fluid requirements (64-100 ounces) -Protein requirements (60-80g) -Strategies for tolerating diet -Advancement of diet to include Starchy vegetables -Barriers to inclusion of new foods -Inclusion of appropriate multivitamin and calcium supplements  -Exercise recommendations   Fluid intake: adequate   Medications: See List Supplementation: appropriate   Using straws: no Drinking while eating: no Having you been chewing well: yes Chewing/swallowing difficulties: no Changes in vision: no Changes to mood/headaches: no Hair loss/Cahnges to skin/Changes to nails: no Any difficulty focusing or concentrating: no Sweating: no Dizziness/Lightheaded: no Palpitations: no  Carbonated beverages: no N/V/D/C/GAS: no Abdominal Pain: no Dumping syndrome: no  Recent physical activity:  ADL's  Progress Towards Goal(s):  In Progress  Handouts given during visit include:  Phase V diet Progression   Goals Sheet  The Benefits of Exercise are endless.....  Support Group Topics  Pt Chosen Goals:  I will conduct (specific number) __4______ stress reducing activities a week by  Name the Activities: __yoga________________  ___read_________________          ________running___________  ____________________  Physical Activity Goals: I will (type of movement)  _____cardio____________________ for (hours/minutes) ____30______, for (how many days a week) _______4___________________ by (specific date) ____3 months____________  Teaching Method Utilized:  Visual Auditory Hands on  Demonstrated degree of understanding via:  Teach Back   Monitoring/Evaluation:  Dietary intake, exercise, and body weight. Follow up in 3 months for 6 weeks post-op visit.

## 2020-01-22 IMAGING — DX DG CHEST 2V
2 series · 2 of 2 positions shown · non-contrast
Comparison: None.

CLINICAL DATA: Preoperative for partial sleeve gastrectomy.
Obesity.

EXAM:
CHEST - 2 VIEW

[chest pa]
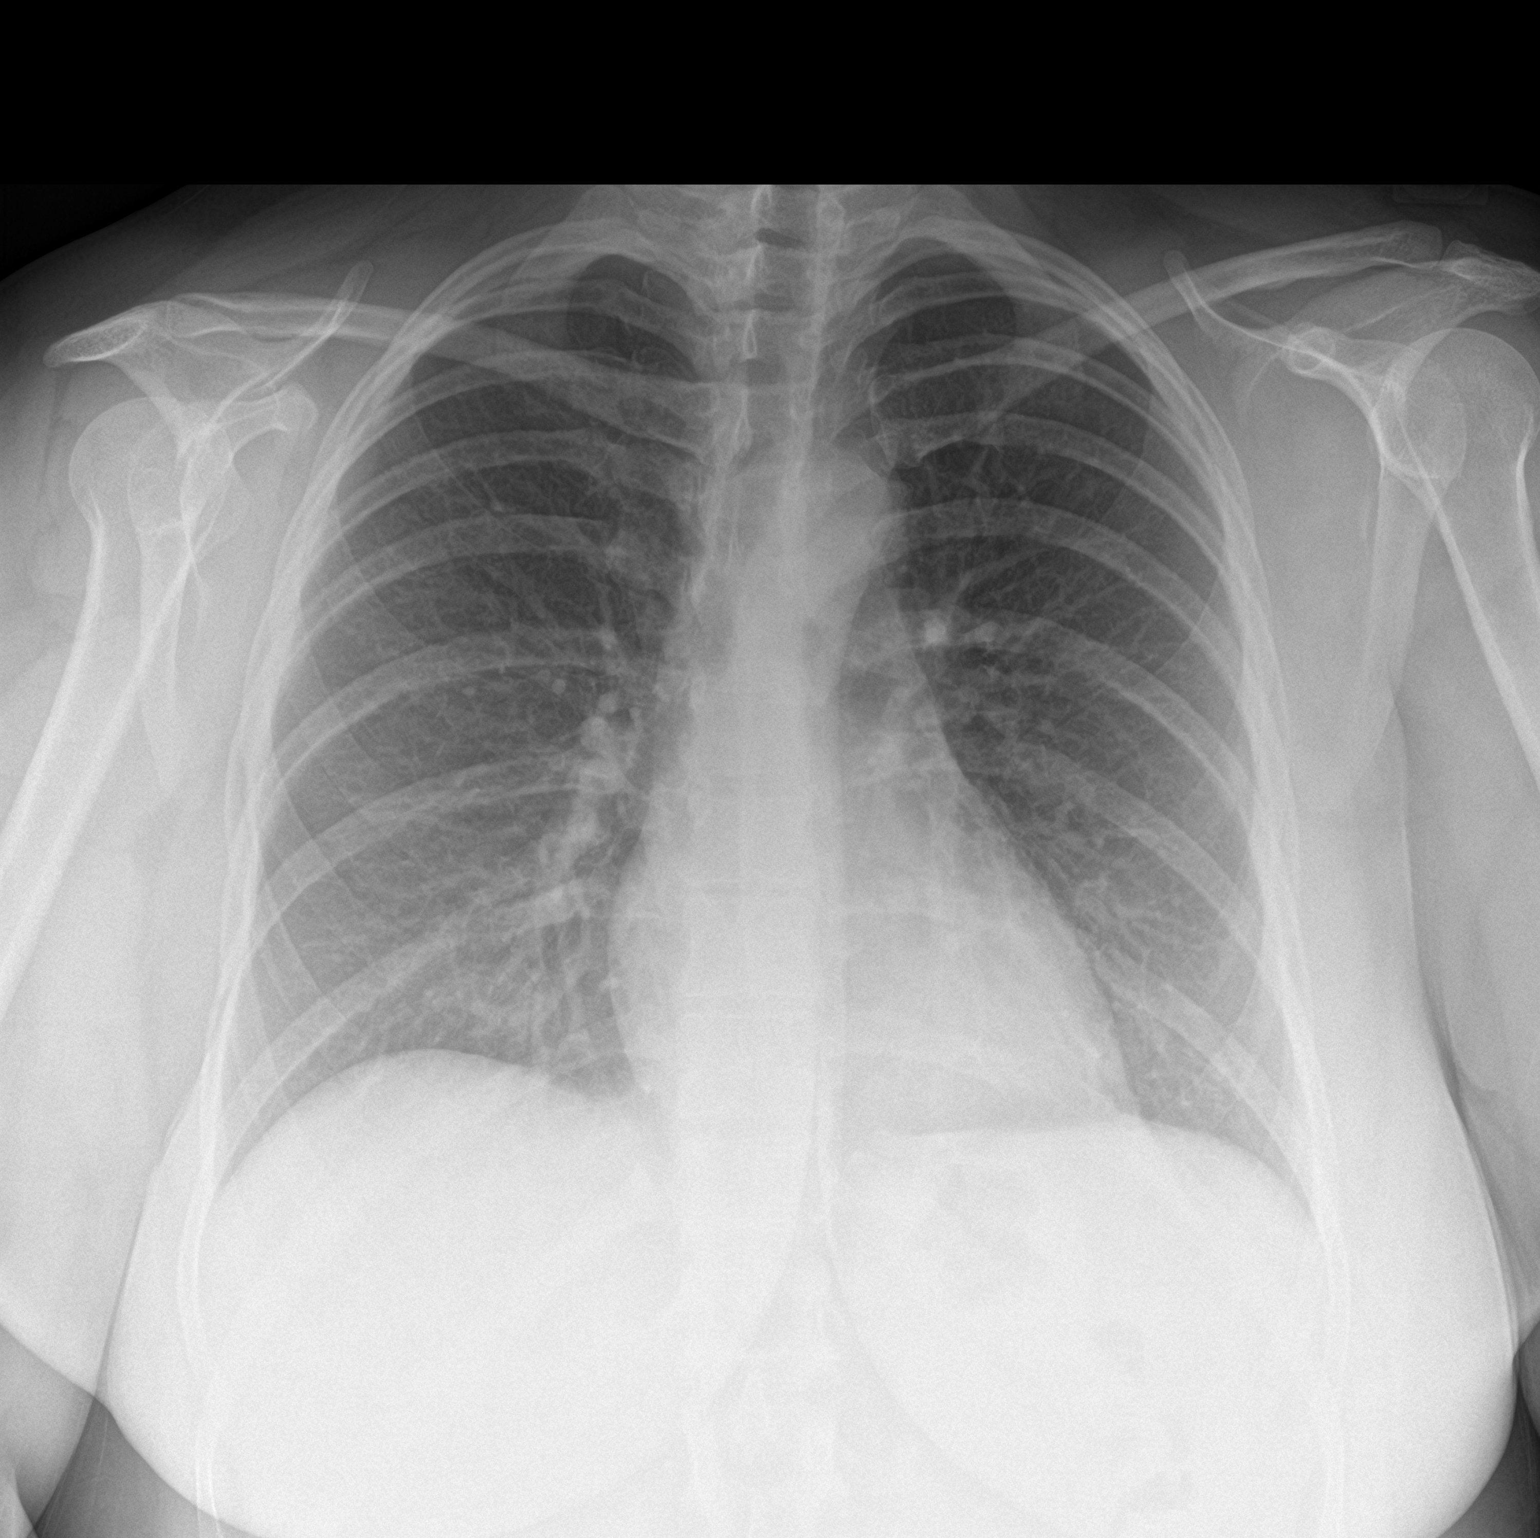

[chest lat]
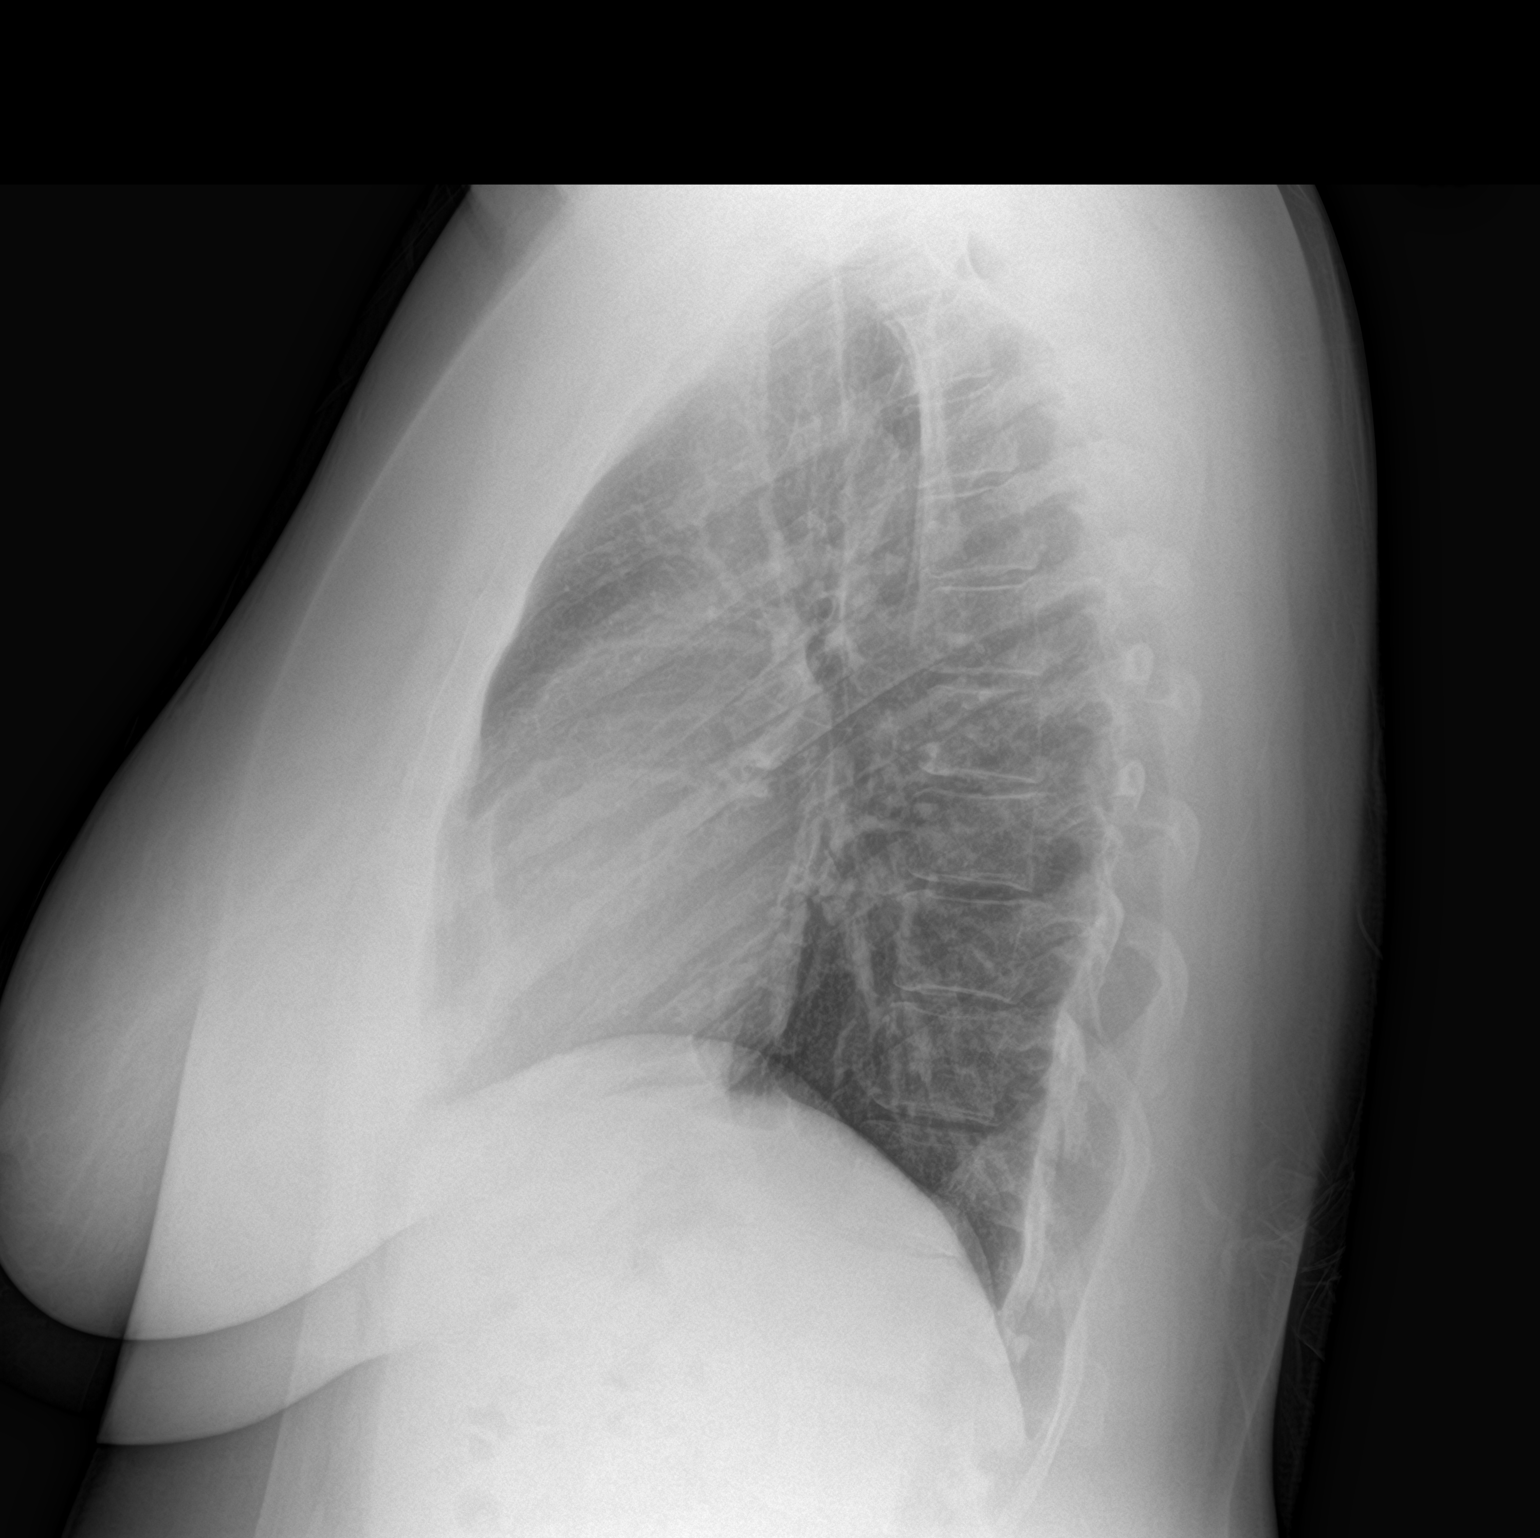

[2 of 2 positions shown; findings below may reference images not displayed]

FINDINGS: The lungs appear clear.  Cardiac and mediastinal contours normal.

No pleural effusion identified.

No significant osseous abnormalities are observed.
IMPRESSION: No active cardiopulmonary disease.

## 2020-02-06 ENCOUNTER — Encounter (HOSPITAL_COMMUNITY): Payer: Self-pay

## 2022-01-12 ENCOUNTER — Ambulatory Visit (INDEPENDENT_AMBULATORY_CARE_PROVIDER_SITE_OTHER): Payer: Commercial Managed Care - PPO | Admitting: Plastic Surgery

## 2022-01-12 ENCOUNTER — Other Ambulatory Visit: Payer: Self-pay

## 2022-01-12 VITALS — BP 146/90 | HR 80 | Ht 64.0 in | Wt 135.4 lb

## 2022-01-12 DIAGNOSIS — M793 Panniculitis, unspecified: Secondary | ICD-10-CM | POA: Diagnosis not present

## 2022-01-14 ENCOUNTER — Encounter: Payer: Self-pay | Admitting: Plastic Surgery

## 2022-01-14 NOTE — Progress Notes (Signed)
Referring Provider Kinsinger, Arta Bruce, MD [address],  [place] [address]   CC:  Abdomen and breast surgery   Jenny Anderson is an 36 y.o. female.  HPI: Patient is a 36 year old who is interested in abdomen and breast surgery.  Regarding her breast she is interested mastopexy augmentation.  She is unsure whether she would like saline or silicone.  On reviewing photos she was interested in implants in roughly the 300 cc size range.  She would like to be a small to medium C cup.  She notes family history of breast cancer in her fourth cousin.  She had a mammogram 2 years ago at age 36 for a mass that was felt to be benign.  Of note she is healthy and has noted diabetes but she does currently vape with nicotine.  Crying the patient's abdomen she does have rashes in the summer.  She uses over-the-counter powder.  She did have a gastric sleeve she has had 3 pregnancies.  In August 2019.  She has lost 110 pounds.  She is concerned of excess skin above and below her umbilicus  Allergies  Allergen Reactions   Lamictal [Lamotrigine] Hives    Outpatient Encounter Medications as of 01/12/2022  Medication Sig Note   atomoxetine (STRATTERA) 40 MG capsule Take 40 mg by mouth 2 (two) times daily.    buPROPion ER (WELLBUTRIN SR) 100 MG 12 hr tablet Take by mouth.    clonazePAM (KLONOPIN) 0.5 MG tablet Take 0.5 mg by mouth 2 (two) times daily as needed.    FLUoxetine (PROZAC) 20 MG capsule Take 20 mg by mouth daily.    gabapentin (NEURONTIN) 300 MG capsule Take 1 capsule (300 mg total) by mouth 2 (two) times daily. for 30 days (Patient taking differently: Take 1,200 mg by mouth 2 (two) times daily. for 30 days)    levonorgestrel (MIRENA) 20 MCG/24HR IUD 1 each by Intrauterine route once. 07/08/2018: PLACED:11/2016   [DISCONTINUED] ondansetron (ZOFRAN-ODT) 4 MG disintegrating tablet Take 1 tablet (4 mg total) by mouth every 6 (six) hours as needed for nausea or vomiting.     [DISCONTINUED] pantoprazole (PROTONIX) 40 MG tablet Take 1 tablet (40 mg total) by mouth daily.    [DISCONTINUED] traMADol (ULTRAM) 50 MG tablet Take 1 tablet (50 mg total) by mouth every 6 (six) hours as needed (pain).    No facility-administered encounter medications on file as of 01/12/2022.     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   COLPOSCOPY  2012   [place] GASTRIC SLEEVE RESECTION N/A 07/25/2018   Procedure: LAPAROSCOPIC GASTRIC SLEEVE RESECTION, UPPER ENDO, ERAS Pathway;  Surgeon: Kinsinger, Arta Bruce, MD;  Location: [facility];  Service: General;  Laterality: N/A;   LEEP  2012   mirena     pt was pregnant with iud in place    Family History  Problem Relation Age of Onset   Hyperlipidemia Mother    Hypertension Mother    Other Mother        hx of substance abuse   Lung cancer Maternal Grandfather    Lung cancer Paternal Grandfather    Hyperlipidemia Paternal Grandfather    Other Father        hx substance abuse   Hypertension Maternal Grandmother    Hyperlipidemia Maternal Grandmother    Heart disease Maternal Grandmother    Diabetes Maternal Grandmother    Kidney disease Maternal Grandmother     Social History   Social History Narrative   Not on file     Review of Systems General: Denies fevers, chills, weight loss CV: Denies chest pain, shortness of breath, palpitations   Physical Exam Vitals with BMI 01/12/2022 01/25/2019 09/20/2018   Height 5\' 4"  5\' 4"  5\' 4"   Weight 135 lbs 6 oz 168 lbs 201 lbs 10 oz  BMI 23.23 37.48 27.07  Systolic 867 - -  Diastolic 90 - -  Pulse 80 - -    General:  No acute distress,  Alert and oriented, Non-Toxic, Normal speech and affect Breast: Sternal notch to nipple 25 in the right and 26 on the left base width 16 bilaterally nipple to fold 5 cm on the right and 5.5 cm in the left nipple areolar complex to fold 4 cm in the right and 4.5 cm on the left.  No masses palpable.  Moderate breast ptosis. Abdomen: No hernias palpable.  Significant excess skin above and below the umbilicus.  Minimal adipose present.  Assessment/Plan Patient is a 36 year old who is an excellent candidate for abdomen breast surgery other than the fact that she is still vaping with nicotine.  Breast: Patient is an excellent candidate for mastopexy augmentation.  Consider implant most likely a 300 cc size.  We discussed ordering 200 to 350 cc and make an intraoperative decision.  Based on her measurements she will require vertical scar but I am uncertain whether she will require a horizontal scar.  We discussed that This could be an intraoperative decision.  Abdomen: Patient does have abdominal rashes so she would be a candidate for panniculectomy but based on her abdominal contour abdominoplasty would give her the best result.  She is an excellent candidate for abdominoplasty based on physical exam.  Lennice Sites 01/14/2022, 7:25 PM

## 2023-02-19 ENCOUNTER — Encounter (HOSPITAL_COMMUNITY): Payer: Self-pay | Admitting: *Deleted

## 2023-09-22 ENCOUNTER — Other Ambulatory Visit: Payer: Self-pay

## 2023-09-22 ENCOUNTER — Encounter (HOSPITAL_BASED_OUTPATIENT_CLINIC_OR_DEPARTMENT_OTHER): Payer: Self-pay | Admitting: Pediatrics

## 2023-09-22 ENCOUNTER — Emergency Department (HOSPITAL_BASED_OUTPATIENT_CLINIC_OR_DEPARTMENT_OTHER)
Admission: EM | Admit: 2023-09-22 | Discharge: 2023-09-22 | Disposition: A | Discharge status: Home / Self Care | Payer: Commercial Managed Care - PPO | Attending: Emergency Medicine | Admitting: Emergency Medicine

## 2023-09-22 DIAGNOSIS — I10 Essential (primary) hypertension: Secondary | ICD-10-CM | POA: Insufficient documentation

## 2023-09-22 DIAGNOSIS — Z79899 Other long term (current) drug therapy: Secondary | ICD-10-CM | POA: Insufficient documentation

## 2023-09-22 DIAGNOSIS — R519 Headache, unspecified: Secondary | ICD-10-CM

## 2023-09-22 LAB — URINALYSIS, ROUTINE W REFLEX MICROSCOPIC
Bilirubin Urine: NEGATIVE
Glucose, UA: NEGATIVE mg/dL
Hgb urine dipstick: NEGATIVE
Ketones, ur: NEGATIVE mg/dL
Leukocytes,Ua: NEGATIVE
Nitrite: NEGATIVE
Protein, ur: NEGATIVE mg/dL
Specific Gravity, Urine: 1.02 (ref 1.005–1.030)
pH: 7 (ref 5.0–8.0)

## 2023-09-22 LAB — BASIC METABOLIC PANEL
Anion gap: 8 (ref 5–15)
BUN: 14 mg/dL (ref 6–20)
CO2: 27 mmol/L (ref 22–32)
Calcium: 9.2 mg/dL (ref 8.9–10.3)
Chloride: 101 mmol/L (ref 98–111)
Creatinine, Ser: 0.75 mg/dL (ref 0.44–1.00)
GFR, Estimated: >60 mL/min (ref >60)
Glucose, Bld: 86 mg/dL (ref 70–99)
Potassium: 4.1 mmol/L (ref 3.5–5.1)
Sodium: 136 mmol/L (ref 135–145)

## 2023-09-22 LAB — PREGNANCY, URINE: Preg Test, Ur: NEGATIVE

## 2023-09-22 MED ORDER — HYDROCHLOROTHIAZIDE 25 MG PO TABS
25.0000 mg | ORAL_TABLET | Freq: Every day | ORAL | 0 refills | Status: AC
Start: 2023-09-22 — End: ?

## 2023-09-22 NOTE — ED Triage Notes (Signed)
C/O ongoing high blood pressure x 3 weeks, now has eye and head pain x 2 days.

## 2023-09-22 NOTE — Discharge Instructions (Addendum)
Ms Jenny Anderson, Jenny Anderson were seen today for high blood pressure with headaches. Fortunately, you do not show any signs of neurologic or other critical effects of your high blood pressure and you are safe to return home. We will start a medicine to lower blood pressure called hydrochlorothiazide. It is a diuretic so it is likely to increase your urination. Otherwise, go about your normal lifestyle. Please see your primary doctor soon to discuss long-term options for your blood pressure.

## 2023-09-22 NOTE — ED Provider Notes (Signed)
[place] [facility] Provider Note   CSN: [id number] Arrival date & time: 09/22/23  1141     History  Chief Complaint  Patient presents with   Hypertension   Headache    Jenny Anderson is a 37 y.o. female.  Pt prsenting with complaints of HTN x 3 weeks and now headache and associated eye pain x 2 days. She spoke to her PCP over the phone who advised a trip to the ED for quicker treatment. She was found hypertensive at her PCP two weeks ago and has since been keeping a home log of blood pressures with are about 170s/110s, similar to today. She has not yet started antihypertensive medicines. Now she has had a moderate headache off and on for two days that is relieves with ibuprofen and she has eye pain when she has the headache. She does not have visual changes. No chest pain, weakness, tremors, dizziness, abdominal pain, or urinary changes. She does report increased stress lately as her father lives in [place] and there is a hurricane incoming.   Hypertension Associated symptoms include headaches. Pertinent negatives include no chest pain, no abdominal pain and no shortness of breath.  Headache Associated symptoms: eye pain   Associated symptoms: no abdominal pain, no dizziness, no fever, no nausea, no numbness, no vomiting and no weakness        Home Medications Prior to Admission medications   Medication Sig Start Date End Date Taking? Authorizing Provider  atomoxetine (STRATTERA) 40 MG capsule Take 40 mg by mouth 2 (two) times daily. 12/22/21   [provider]  buPROPion ER (WELLBUTRIN SR) 100 MG 12 hr tablet Take by mouth. 10/20/21   [provider]  clonazePAM (KLONOPIN) 0.5 MG tablet Take 0.5 mg by mouth 2 (two) times daily as needed. 11/24/21   [provider]  FLUoxetine (PROZAC) 20 MG capsule Take 20 mg by mouth daily. 10/27/21   [provider]  gabapentin (NEURONTIN) 300 MG capsule Take 1 capsule  (300 mg total) by mouth 2 (two) times daily. for 30 days Patient taking differently: Take 1,200 mg by mouth 2 (two) times daily. for 30 days 07/26/18   Kinsinger, De Blanch, MD  hydrochlorothiazide (HYDRODIURIL) 25 MG tablet Take 1 tablet (25 mg total) by mouth daily. 09/22/23  Yes Katheran James, DO  levonorgestrel (MIRENA) 20 MCG/24HR IUD 1 each by Intrauterine route once.    [provider]      Allergies    Patient has no known allergies.    Review of Systems   Review of Systems  Constitutional:  Negative for chills and fever.  Eyes:  Positive for pain. Negative for discharge and visual disturbance.  Respiratory:  Negative for chest tightness and shortness of breath.   Cardiovascular:  Negative for chest pain and palpitations.  Gastrointestinal:  Negative for abdominal pain, nausea and vomiting.  Neurological:  Positive for headaches. Negative for dizziness, tremors, syncope, weakness and numbness.  Psychiatric/Behavioral:  Negative for agitation and confusion. The patient is nervous/anxious.     Physical Exam Updated Vital Signs BP (!) 157/117   Pulse 71   Temp 98.2 F (36.8 C) (Oral)   Resp 16   Ht 5\' 4"  (1.626 m)   Wt 77.1 kg   SpO2 100%   BMI 29.18 kg/m  Physical Exam Constitutional:      General: She is not in acute distress.    Appearance: She is well-developed. She is not ill-appearing.  HENT:  Head: Normocephalic and atraumatic.  Eyes:     General: No visual field deficit.    Extraocular Movements: Extraocular movements intact.     Pupils: Pupils are equal, round, and reactive to light.  Cardiovascular:     Rate and Rhythm: Normal rate and regular rhythm.     Heart sounds: Normal heart sounds.  Pulmonary:     Effort: Pulmonary effort is normal. No respiratory distress.     Breath sounds: Normal breath sounds.  Abdominal:     General: Bowel sounds are normal.     Palpations: Abdomen is soft.     Tenderness: There is no abdominal  tenderness.  Neurological:     Mental Status: She is alert and oriented to person, place, and time.     Cranial Nerves: No cranial nerve deficit or facial asymmetry.     Sensory: No sensory deficit.     Motor: No weakness.  Psychiatric:        Mood and Affect: Mood normal.        Behavior: Behavior normal.     ED Results / Procedures / Treatments   Labs (all labs ordered are listed, but only abnormal results are displayed) Labs Reviewed  BASIC METABOLIC PANEL  URINALYSIS, ROUTINE W REFLEX MICROSCOPIC  PREGNANCY, URINE    EKG None  Radiology No results found.  Procedures Procedures    Medications Ordered in ED Medications - No data to display  ED Course/ Medical Decision Making/ A&P                                 Medical Decision Making Jenny Anderson presented for HTN x 3 weeks and headache with eye pain but no visual changes x 2 days. She is underway with home blood pressure tracking by her PCP who is working up her HTN, but not yet started any medicines. On presentation, blood pressure was 189/120. There was a mild headache present, but not severe, and no visual changes. No other symptoms including dizziness, weakness, chest pain, palpitations. Her exam, including neurologic and funduscopic, was unremarkable. UA, BMP, pregnancy tests WNL. Not concerned for stroke or any other acute end-organ damages. Pt was in no acute distress and felt satisfied that she was safe from emergency issues. She will be sent home with a brief prescription for hydrochlorothiazide and she has a follow-up with her PCP in about 2 weeks.  Amount and/or Complexity of Data Reviewed Labs: ordered.    Details: UA, BMP, Hcg  Risk Prescription drug management.          Final Clinical Impression(s) / ED Diagnoses Final diagnoses:  Primary hypertension  Acute nonintractable headache, unspecified headache type    Rx / DC Orders ED Discharge Orders          Ordered     hydrochlorothiazide (HYDRODIURIL) 25 MG tablet  Daily        09/22/23 1334              Katheran James, DO 09/22/23 1339    Maia Plan, MD 09/22/23 1511

## 2024-02-24 ENCOUNTER — Encounter (HOSPITAL_COMMUNITY): Payer: Self-pay | Admitting: *Deleted
